# Patient Record
Sex: Female | Born: 1975 | Race: White | Hispanic: No | Marital: Single | Smoking: Never smoker
Health system: Southern US, Community
[De-identification: ages and names within clinical notes are randomized; demographics above are authoritative.]

## PROBLEM LIST (undated history)

## (undated) DIAGNOSIS — I10 Essential (primary) hypertension: Secondary | ICD-10-CM

## (undated) HISTORY — PX: NO PAST SURGERIES: SHX2092

---

## 2011-03-29 ENCOUNTER — Emergency Department (HOSPITAL_COMMUNITY)
Admission: EM | Admit: 2011-03-29 | Discharge: 2011-03-29 | Disposition: A | Discharge status: Home / Self Care | Payer: Self-pay | Attending: Emergency Medicine | Admitting: Emergency Medicine

## 2011-03-29 ENCOUNTER — Encounter: Payer: Self-pay | Admitting: Emergency Medicine

## 2011-03-29 DIAGNOSIS — R6883 Chills (without fever): Secondary | ICD-10-CM | POA: Insufficient documentation

## 2011-03-29 DIAGNOSIS — R200 Anesthesia of skin: Secondary | ICD-10-CM

## 2011-03-29 DIAGNOSIS — H43399 Other vitreous opacities, unspecified eye: Secondary | ICD-10-CM | POA: Insufficient documentation

## 2011-03-29 DIAGNOSIS — R209 Unspecified disturbances of skin sensation: Secondary | ICD-10-CM | POA: Insufficient documentation

## 2011-03-29 LAB — DIFFERENTIAL
Basophils Absolute: 0 10*3/uL (ref 0.0–0.1)
Basophils Relative: 0 % (ref 0–1)
Eosinophils Absolute: 0.1 10*3/uL (ref 0.0–0.7)
Eosinophils Relative: 1 % (ref 0–5)
Monocytes Absolute: 0.6 10*3/uL (ref 0.1–1.0)
Monocytes Relative: 7 % (ref 3–12)

## 2011-03-29 LAB — BASIC METABOLIC PANEL
BUN: 11 mg/dL (ref 6–23)
Calcium: 9.6 mg/dL (ref 8.4–10.5)
Chloride: 103 mEq/L (ref 96–112)
Creatinine, Ser: 0.7 mg/dL (ref 0.50–1.10)
GFR calc Af Amer: >90 mL/min (ref >90)
GFR calc non Af Amer: >90 mL/min (ref >90)

## 2011-03-29 LAB — CBC
HCT: 41.8 % (ref 36.0–46.0)
Hemoglobin: 14.1 g/dL (ref 12.0–15.0)
MCH: 29.6 pg (ref 26.0–34.0)
MCHC: 33.7 g/dL (ref 30.0–36.0)
MCV: 87.6 fL (ref 78.0–100.0)
RDW: 13.4 % (ref 11.5–15.5)

## 2011-03-29 NOTE — ED Notes (Signed)
Medical screening exam:  36 year old female had onset at about 1730 of a heavy feeling in her head and upper body and then numbness in her left hand. There was some associated lightheadedness and some dry mouth. No circumoral numbness. She did not notice any focal weakness. Brief neurologic exam shows her mentation appears to be normal, cranial nerves are intact without any facial droop, there are no focal motor or sensory deficits. There is no pronator drift and no extinction on double simultaneous stimulation. I see no evidence of acute stroke and suspect her symptoms are related to hyperventilation.  Dione Booze, MD 03/29/11 [phone]

## 2011-03-29 NOTE — ED Notes (Signed)
Patient with heaviness and numbness in left arm/hand.  Patient states that it started around 1730 today.  Patient is CAOx3, family at bedside.

## 2011-03-29 NOTE — ED Provider Notes (Signed)
Medical screening examination/treatment/procedure(s) were performed by non-physician practitioner and as supervising physician I was immediately available for consultation/collaboration.  Avielle Imbert, MD 03/29/11 2335 

## 2011-03-29 NOTE — ED Provider Notes (Signed)
History     CSN: [id number]  Arrival date & time 03/29/11  6213   First MD Initiated Contact with Patient 03/29/11 2134      Chief Complaint  Patient presents with  . 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  She said at her computer and noticed a heaviness in her upper body and then numbness and tingling in bilateral arms and she felt very higher suddenly she also reports, that she's been having floaters in her eyes bilaterally for the last several months.  She has not had an eye exam in over 2 years.  She does wear glasses.  She denies headaches, nausea, vomiting, previous history of same has a family history of diabetes and heart disease, but no personal history of either.  She has been under a great deal of stress lately as her husband passed away 2 months ago from an MI.  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  The symptoms have now resolved  The history is provided by the patient.    History reviewed. No pertinent past medical history.  History reviewed. No pertinent past surgical history.  History reviewed. No pertinent family history.  History  Substance Use Topics  . Smoking status: Never Smoker   . Smokeless tobacco: Not on file  . Alcohol Use: No    OB History    Grav Para Term Preterm Abortions TAB SAB Ect Mult Living                  Review of Systems  Constitutional: Positive for chills. Negative for fever.  HENT: Negative for rhinorrhea.   Eyes: Positive for visual disturbance.  Respiratory: Negative for cough and shortness of breath.   Cardiovascular: Negative for chest pain.  Gastrointestinal: Negative for nausea.  Genitourinary: Negative.   Musculoskeletal: Negative.   Skin: Negative.     Neurological: Positive for numbness. Negative for dizziness, syncope, facial asymmetry and weakness.  Hematological: Negative.   Psychiatric/Behavioral: Negative.     Allergies  Review of patient's allergies indicates no known allergies.  Home Medications   Current Outpatient Rx  Name Route Sig Dispense Refill  . VITAMIN C PO Oral Take 1 tablet by mouth every morning.      . SUPER B COMPLEX PO TABS Oral Take 1 tablet by mouth every morning.      Marland Kitchen BILBERRY PO Oral Take 1 tablet by mouth every morning.      Marland Kitchen CALCIUM + D PO Oral Take 1 tablet by mouth every morning.      Marland Kitchen OMEGA-3 FATTY ACIDS 1000 MG PO CAPS Oral Take 1 g by mouth daily.      . ADULT MULTIVITAMIN W/MINERALS CH Oral Take 1 tablet by mouth every morning.        BP 151/79  Pulse 98  Temp(Src) 98.4 F (36.9 C) (Oral)  Resp 18  SpO2 97%  Physical Exam  Constitutional: She is oriented to person, place, and time. She appears well-developed and well-nourished.  HENT:  Head: Normocephalic.  Right Ear: External ear normal.  Left Ear: External ear normal.  Nose: Nose normal.  Mouth/Throat: Oropharynx is clear and moist.  Eyes: EOM are normal. Pupils are equal, round, and reactive to light. Right eye exhibits no discharge. Left eye exhibits no discharge. No scleral  icterus.  Neck: Normal range of motion.  Cardiovascular: Normal rate.   Pulmonary/Chest: Effort normal.  Abdominal: Soft.  Musculoskeletal: Normal range of motion.  Neurological: She is alert and oriented to person, place, and time.  Skin: Skin is warm and dry.  Psychiatric: She has a normal mood and affect.    ED Course  Procedures (including critical care time)   Labs Reviewed  CBC  DIFFERENTIAL  BASIC METABOLIC PANEL   No results found.   1. Numbness and tingling     Patient was examined by Dr. Renella Cunas pot prior to my arrival and assessment to not be an acute stroke.  He did order CBC, electrolytes, which I am awaiting the results  MDM    After examining this patient.  I feel that this is most likely panic or anxiety       Arman Filter, NP 03/29/11 2215  Arman Filter, NP 03/29/11 2313

## 2011-03-29 NOTE — ED Notes (Signed)
Pt c/o heaviness all over body with left arm numbness and "sparkles in eyes" starting today at 1700; pt with no neuro deficits at present; EDP DG to eval pt

## 2013-08-15 ENCOUNTER — Emergency Department (INDEPENDENT_AMBULATORY_CARE_PROVIDER_SITE_OTHER)
Admission: EM | Admit: 2013-08-15 | Discharge: 2013-08-15 | Disposition: A | Discharge status: Home / Self Care | Payer: Self-pay | Source: Home / Self Care | Attending: Family Medicine | Admitting: Family Medicine

## 2013-08-15 ENCOUNTER — Encounter (HOSPITAL_COMMUNITY): Payer: Self-pay | Admitting: Emergency Medicine

## 2013-08-15 DIAGNOSIS — R609 Edema, unspecified: Secondary | ICD-10-CM

## 2013-08-15 DIAGNOSIS — E8779 Other fluid overload: Secondary | ICD-10-CM

## 2013-08-15 DIAGNOSIS — R7309 Other abnormal glucose: Secondary | ICD-10-CM

## 2013-08-15 DIAGNOSIS — I10 Essential (primary) hypertension: Secondary | ICD-10-CM | POA: Diagnosis present

## 2013-08-15 DIAGNOSIS — R7303 Prediabetes: Secondary | ICD-10-CM | POA: Diagnosis present

## 2013-08-15 DIAGNOSIS — E669 Obesity, unspecified: Secondary | ICD-10-CM | POA: Diagnosis present

## 2013-08-15 LAB — COMPREHENSIVE METABOLIC PANEL
ALBUMIN: 3.7 g/dL (ref 3.5–5.2)
ALK PHOS: 95 U/L (ref 39–117)
ALT: 25 U/L (ref 0–35)
AST: 17 U/L (ref 0–37)
BILIRUBIN TOTAL: 0.4 mg/dL (ref 0.3–1.2)
BUN: 9 mg/dL (ref 6–23)
CHLORIDE: 100 meq/L (ref 96–112)
CO2: 26 mEq/L (ref 19–32)
Calcium: 9.2 mg/dL (ref 8.4–10.5)
Creatinine, Ser: 0.76 mg/dL (ref 0.50–1.10)
GFR calc non Af Amer: >90 mL/min (ref >90)
GLUCOSE: 116 mg/dL — AB (ref 70–99)
POTASSIUM: 3.9 meq/L (ref 3.7–5.3)
SODIUM: 138 meq/L (ref 137–147)
Total Protein: 7.6 g/dL (ref 6.0–8.3)

## 2013-08-15 LAB — CBC
HCT: 44.9 % (ref 36.0–46.0)
HEMOGLOBIN: 15 g/dL (ref 12.0–15.0)
MCH: 30.4 pg (ref 26.0–34.0)
MCHC: 33.4 g/dL (ref 30.0–36.0)
MCV: 91.1 fL (ref 78.0–100.0)
Platelets: 176 10*3/uL (ref 150–400)
RBC: 4.93 MIL/uL (ref 3.87–5.11)
RDW: 13.6 % (ref 11.5–15.5)
WBC: 7.5 10*3/uL (ref 4.0–10.5)

## 2013-08-15 LAB — D-DIMER, QUANTITATIVE (NOT AT ARMC)

## 2013-08-15 LAB — PRO B NATRIURETIC PEPTIDE: Pro B Natriuretic peptide (BNP): 57.9 pg/mL (ref 0–125)

## 2013-08-15 MED ORDER — LISINOPRIL-HYDROCHLOROTHIAZIDE 10-12.5 MG PO TABS: 1.0000 | ORAL_TABLET | Freq: Every day | ORAL | Status: DC

## 2013-08-15 NOTE — ED Notes (Signed)
Pt c/o bilateral leg/foot swelling onset 2 yrs +++  Also c/o intermittent hand swelling No PCP; alert w/no signs of acute distress.

## 2013-08-15 NOTE — Discharge Instructions (Signed)
Thank you for coming in today. [facility].  Recheck blood work in about one month.  Make sure to use contraception to avoid pregnancy with a blood pressure medication. Call or go to the emergency room if you get worse, have trouble breathing, have chest pains, or palpitations.    Arterial Hypertension Arterial hypertension (high blood pressure) is a condition of elevated pressure in your blood vessels. Hypertension over a long period of time is a risk factor for strokes, heart attacks, and heart failure. It is also the leading cause of kidney (renal) failure.  CAUSES   In Adults -- Over 90% of all hypertension has no known cause. This is called essential or primary hypertension. In the other 10% of people with hypertension, the increase in blood pressure is caused by another disorder. This is called secondary hypertension. Important causes of secondary hypertension are:  Heavy alcohol use.  Obstructive sleep apnea.  Hyperaldosterosim (Conn's syndrome).  Steroid use.  Chronic kidney failure.  Hyperparathyroidism.  Medications.  Renal artery stenosis.  Pheochromocytoma.  Cushing's disease.  Coarctation of the aorta.  Scleroderma renal crisis.  Licorice (in excessive amounts).  Drugs (cocaine, methamphetamine). Your caregiver can explain any items above that apply to you.  In Children -- Secondary hypertension is more common and should always be considered.  Pregnancy -- Few women of childbearing age have high blood pressure. However, up to 10% of them develop hypertension of pregnancy. Generally, this will not harm the woman. It may be a sign of 3 complications of pregnancy: preeclampsia, HELLP syndrome, and eclampsia. Follow up and control with medication is necessary. SYMPTOMS   This condition normally does not produce any noticeable symptoms. It is usually found during a routine exam.  Malignant hypertension is a  late problem of high blood pressure. It may have the following symptoms:  Headaches.  Blurred vision.  End-organ damage (this means your kidneys, heart, lungs, and other organs are being damaged).  Stressful situations can increase the blood pressure. If a person with normal blood pressure has their blood pressure go up while being seen by their caregiver, this is often termed "white coat hypertension." Its importance is not known. It may be related with eventually developing hypertension or complications of hypertension.  Hypertension is often confused with mental tension, stress, and anxiety. DIAGNOSIS  The diagnosis is made by 3 separate blood pressure measurements. They are taken at least 1 week apart from each other. If there is organ damage from hypertension, the diagnosis may be made without repeat measurements. Hypertension is usually identified by having blood pressure readings:  Above 140/90 mmHg measured in both arms, at 3 separate times, over a couple weeks.  Over 130/80 mmHg should be considered a risk factor and may require treatment in patients with diabetes. Blood pressure readings over 120/80 mmHg are called "pre-hypertension" even in non-diabetic patients. To get a true blood pressure measurement, use the following guidelines. Be aware of the factors that can alter blood pressure readings.  Take measurements at least 1 hour after caffeine.  Take measurements 30 minutes after smoking and without any stress. This is another reason to quit smoking  it raises your blood pressure.  Use a proper cuff size. Ask your caregiver if you are not sure about your cuff size.  Most home blood pressure cuffs are automatic. They will measure systolic and diastolic pressures. The systolic pressure is the pressure reading at the start of sounds. Diastolic pressure  is the pressure at which the sounds disappear. If you are elderly, measure pressures in multiple postures. Try sitting, lying or  standing.  Sit at rest for a minimum of 5 minutes before taking measurements.  You should not be on any medications like decongestants. These are found in many cold medications.  Record your blood pressure readings and review them with your caregiver. If you have hypertension:  Your caregiver may do tests to be sure you do not have secondary hypertension (see "causes" above).  Your caregiver may also look for signs of metabolic syndrome. This is also called Syndrome X or Insulin Resistance Syndrome. You may have this syndrome if you have type 2 diabetes, abdominal obesity, and abnormal blood lipids in addition to hypertension.  Your caregiver will take your medical and family history and perform a physical exam.  Diagnostic tests may include blood tests (for glucose, cholesterol, potassium, and kidney function), a urinalysis, or an EKG. Other tests may also be necessary depending on your condition. PREVENTION  There are important lifestyle issues that you can adopt to reduce your chance of developing hypertension:  Maintain a normal weight.  Limit the amount of salt (sodium) in your diet.  Exercise often.  Limit alcohol intake.  Get enough potassium in your diet. Discuss specific advice with your caregiver.  Follow a DASH diet (dietary approaches to stop hypertension). This diet is rich in fruits, vegetables, and low-fat dairy products, and avoids certain fats. PROGNOSIS  Essential hypertension cannot be cured. Lifestyle changes and medical treatment can lower blood pressure and reduce complications. The prognosis of secondary hypertension depends on the underlying cause. Many people whose hypertension is controlled with medicine or lifestyle changes can live a normal, healthy life.  RISKS AND COMPLICATIONS  While high blood pressure alone is not an illness, it often requires treatment due to its short- and long-term effects on many organs. Hypertension increases your risk  for:  CVAs or strokes (cerebrovascular accident).  Heart failure due to chronically high blood pressure (hypertensive cardiomyopathy).  Heart attack (myocardial infarction).  Damage to the retina (hypertensive retinopathy).  Kidney failure (hypertensive nephropathy). Your caregiver can explain list items above that apply to you. Treatment of hypertension can significantly reduce the risk of complications. TREATMENT   For overweight patients, weight loss and regular exercise are recommended. Physical fitness lowers blood pressure.  Mild hypertension is usually treated with diet and exercise. A diet rich in fruits and vegetables, fat-free dairy products, and foods low in fat and salt (sodium) can help lower blood pressure. Decreasing salt intake decreases blood pressure in a 1/3 of people.  Stop smoking if you are a smoker. The steps above are highly effective in reducing blood pressure. While these actions are easy to suggest, they are difficult to achieve. Most patients with moderate or severe hypertension end up requiring medications to bring their blood pressure down to a normal level. There are several classes of medications for treatment. Blood pressure pills (antihypertensives) will lower blood pressure by their different actions. Lowering the blood pressure by 10 mmHg may decrease the risk of complications by as much as 25%. The goal of treatment is effective blood pressure control. This will reduce your risk for complications. Your caregiver will help you determine the best treatment for you according to your lifestyle. What is excellent treatment for one person, may not be for you. HOME CARE INSTRUCTIONS   Do not smoke.  Follow the lifestyle changes outlined in the "Prevention" section.  If  you are on medications, follow the directions carefully. Blood pressure medications must be taken as prescribed. Skipping doses reduces their benefit. It also puts you at risk for  problems.  Follow up with your caregiver, as directed.  If you are asked to monitor your blood pressure at home, follow the guidelines in the "Diagnosis" section above. SEEK MEDICAL CARE IF:   You think you are having medication side effects.  You have recurrent headaches or lightheadedness.  You have swelling in your ankles.  You have trouble with your vision. SEEK IMMEDIATE MEDICAL CARE IF:   You have sudden onset of chest pain or pressure, difficulty breathing, or other symptoms of a heart attack.  You have a severe headache.  You have symptoms of a stroke (such as sudden weakness, difficulty speaking, difficulty walking). MAKE SURE YOU:   Understand these instructions.  Will watch your condition.  Will get help right away if you are not doing well or get worse. Document Released: 03/07/2005 Document Revised: 05/30/2011 Document Reviewed: 10/05/2006 [facility] Patient Information 2014 [place], [place].

## 2013-08-15 NOTE — ED Provider Notes (Signed)
Tasha Owens is a 38 y.o. female who presents to Urgent Care today for leg swelling for 2 years. Patient has bilateral leg swelling has been present for 2 years but worse recently. She also suspects that she has sleep apnea with daytime sleepiness and fatigue and loud snoring. Additionally she's concerned about the possibility of diabetes. She is here today to look to establish care. She has no health insurance and limited funds. She denies any chest pains significant shortness of breath or palpitations. She does not take any medications yet.   History reviewed. No pertinent past medical history. History  Substance Use Topics  . Smoking status: Never Smoker   . Smokeless tobacco: Not on file  . Alcohol Use: No   ROS as above Medications: No current facility-administered medications for this encounter.   Current Outpatient Prescriptions  Medication Sig Dispense Refill  . Ascorbic Acid (VITAMIN C PO) Take 1 tablet by mouth every morning.        . B Complex-C (SUPER B COMPLEX) TABS Take 1 tablet by mouth every morning.        . Bilberry, Vaccinium myrtillus, (BILBERRY PO) Take 1 tablet by mouth every morning.        . Calcium Carbonate-Vitamin D (CALCIUM + D PO) Take 1 tablet by mouth every morning.        . fish oil-omega-3 fatty acids 1000 MG capsule Take 1 g by mouth daily.        Marland Kitchen lisinopril-hydrochlorothiazide (ZESTORETIC) 10-12.5 MG per tablet Take 1 tablet by mouth daily.  30 tablet  1  . Multiple Vitamin (MULITIVITAMIN WITH MINERALS) TABS Take 1 tablet by mouth every morning.          Exam:  BP 151/87  Pulse 81  Temp(Src) 97.7 F (36.5 C) (Oral)  Resp 12  SpO2 99% Gen: Well NAD obese HEENT: EOMI,  MMM large neck Lungs: Normal work of breathing. CTABL Heart: RRR no MRG Abd: NABS, Soft. NT, ND Exts: Brisk capillary refill, warm and well perfused. 1+ pitting edema to the shins bilaterally. Pulses are intact distally.  Labs are fasting Results for orders placed during  the hospital encounter of 08/15/13 (from the past 24 hour(s))  COMPREHENSIVE METABOLIC PANEL     Status: Abnormal   Collection Time    08/15/13 10:14 AM      Result Value Ref Range   Sodium 138  137 - 147 mEq/L   Potassium 3.9  3.7 - 5.3 mEq/L   Chloride 100  96 - 112 mEq/L   CO2 26  19 - 32 mEq/L   Glucose, Bld 116 (*) 70 - 99 mg/dL   BUN 9  6 - 23 mg/dL   Creatinine, Ser 1.15  0.50 - 1.10 mg/dL   Calcium 9.2  8.4 - 72.6 mg/dL   Total Protein 7.6  6.0 - 8.3 g/dL   Albumin 3.7  3.5 - 5.2 g/dL   AST 17  0 - 37 U/L   ALT 25  0 - 35 U/L   Alkaline Phosphatase 95  39 - 117 U/L   Total Bilirubin 0.4  0.3 - 1.2 mg/dL   GFR calc non Af Amer >90  >90 mL/min   GFR calc Af Amer >90  >90 mL/min  D-DIMER, QUANTITATIVE     Status: None   Collection Time    08/15/13 10:14 AM      Result Value Ref Range   D-Dimer, Quant <0.27  0.00 - 0.48 ug/mL-FEU  PRO  B NATRIURETIC PEPTIDE     Status: None   Collection Time    08/15/13 10:14 AM      Result Value Ref Range   Pro B Natriuretic peptide (BNP) 57.9  0 - 125 pg/mL  CBC     Status: None   Collection Time    08/15/13 10:14 AM      Result Value Ref Range   WBC 7.5  4.0 - 10.5 K/uL   RBC 4.93  3.87 - 5.11 MIL/uL   Hemoglobin 15.0  12.0 - 15.0 g/dL   HCT 16.144.9  09.636.0 - 04.546.0 %   MCV 91.1  78.0 - 100.0 fL   MCH 30.4  26.0 - 34.0 pg   MCHC 33.4  30.0 - 36.0 g/dL   RDW 40.913.6  81.111.5 - 91.415.5 %   Platelets 176  150 - 400 K/uL   No results found.  Assessment and Plan: 38 y.o. female with  1) elevated blood pressure: Plan to start lisinopril hydrochlorothiazide. Recheck labs in one month. Refer to PCP.  2) lower extremity edema: Creatinine and BNP are normal. Hydrochlorothiazide with lisinopril.  3) concern for sleep apnea: I agree. Followup with [facility] possible referral for sleep study. 4) obesity: Encourage weight loss 5) elevated fasting blood sugar: Not yet in the diabetic range. Recommend hemoglobin A1c at lab  recheck  Discussed warning signs or symptoms. Please see discharge instructions. Patient expresses understanding.    Rodolph BongEvan S Takita Riecke, MD 08/15/13 [phone]

## 2013-09-09 ENCOUNTER — Ambulatory Visit (INDEPENDENT_AMBULATORY_CARE_PROVIDER_SITE_OTHER): Payer: Self-pay | Admitting: Internal Medicine

## 2013-09-09 ENCOUNTER — Encounter: Payer: Self-pay | Admitting: Internal Medicine

## 2013-09-09 VITALS — BP 116/67 | HR 93 | Temp 97.5°F | Resp 20 | Ht 68.0 in | Wt 272.0 lb

## 2013-09-09 DIAGNOSIS — G8929 Other chronic pain: Secondary | ICD-10-CM

## 2013-09-09 DIAGNOSIS — R7303 Prediabetes: Secondary | ICD-10-CM

## 2013-09-09 DIAGNOSIS — G2581 Restless legs syndrome: Secondary | ICD-10-CM

## 2013-09-09 DIAGNOSIS — E8881 Metabolic syndrome: Secondary | ICD-10-CM

## 2013-09-09 DIAGNOSIS — M25562 Pain in left knee: Secondary | ICD-10-CM

## 2013-09-09 DIAGNOSIS — R739 Hyperglycemia, unspecified: Secondary | ICD-10-CM

## 2013-09-09 DIAGNOSIS — G4710 Hypersomnia, unspecified: Secondary | ICD-10-CM

## 2013-09-09 DIAGNOSIS — I10 Essential (primary) hypertension: Secondary | ICD-10-CM

## 2013-09-09 DIAGNOSIS — M25569 Pain in unspecified knee: Secondary | ICD-10-CM

## 2013-09-09 DIAGNOSIS — G4719 Other hypersomnia: Secondary | ICD-10-CM

## 2013-09-09 DIAGNOSIS — R7309 Other abnormal glucose: Secondary | ICD-10-CM

## 2013-09-09 DIAGNOSIS — R609 Edema, unspecified: Secondary | ICD-10-CM

## 2013-09-09 DIAGNOSIS — R600 Localized edema: Secondary | ICD-10-CM

## 2013-09-09 LAB — TSH: TSH: 2.86 u[IU]/mL (ref 0.350–4.500)

## 2013-09-09 LAB — HEMOGLOBIN A1C
HEMOGLOBIN A1C: 6.6 % — AB (ref <5.7)
Mean Plasma Glucose: 143 mg/dL — ABNORMAL HIGH (ref <117)

## 2013-09-09 LAB — IRON: IRON: 82 ug/dL (ref 42–145)

## 2013-09-09 NOTE — Progress Notes (Signed)
Patient ID: Tasha Owens Horvath, female   DOB: 09-Jun-1975, 38 y.o.   MRN: [id number]   Tasha Owens Slabach, is a 38 y.o. female  [id number]  [id number]  DOB - 09-Jun-1975  CC:  Chief Complaint  Patient presents with  . Establish Care    NP/ Throat slightly sore today head congestion/Lt knee pain Knee tends to lock up Pt states .Pt states Bilat Leg and feet swelling       HPI: Tasha Owens Barrientez is a 38 y.o. female here today to establish medical care. Patient has No headache, No chest pain, No abdominal pain - No Nausea, No new weakness tingling or numbness, No Cough - SOB.  No Known Allergies No past medical history on file. Current Outpatient Prescriptions on File Prior to Visit  Medication Sig Dispense Refill  . lisinopril-hydrochlorothiazide (ZESTORETIC) 10-12.5 MG per tablet Take 1 tablet by mouth daily.  30 tablet  1   No current facility-administered medications on file prior to visit.   No family history on file. History   Social History  . Marital Status: Single    Spouse Name: N/A    Number of Children: N/A  . Years of Education: N/A   Occupational History  . Not on file.   Social History Main Topics  . Smoking status: Never Smoker   . Smokeless tobacco: Not on file  . Alcohol Use: No  . Drug Use: No  . Sexual Activity:    Other Topics Concern  . Not on file   Social History Narrative  . No narrative on file    Review of Systems: Constitutional: Negative for fever, chills, diaphoresis, activity change, appetite change and fatigue. HENT: Negative for ear pain, nosebleeds, congestion, facial swelling, rhinorrhea, neck pain, neck stiffness and ear discharge.  Eyes: Negative for pain, discharge, redness, itching and visual disturbance. Respiratory: Negative for cough, choking, chest tightness, shortness of breath, wheezing and stridor.  Cardiovascular: Negative for chest pain, palpitations and leg swelling. Gastrointestinal: Negative for abdominal  distention. Genitourinary: Negative for dysuria, urgency, frequency, hematuria, flank pain, decreased urine volume, difficulty urinating and dyspareunia.  Musculoskeletal: Negative for back pain, joint swelling, arthralgia and gait problem. Neurological: Negative for dizziness, tremors, seizures, syncope, facial asymmetry, speech difficulty, weakness, light-headedness, numbness and headaches.  Hematological: Negative for adenopathy. Does not bruise/bleed easily. Psychiatric/Behavioral: Negative for hallucinations, behavioral problems, confusion, dysphoric mood, decreased concentration and agitation.    Objective:         Filed Vitals:   09/09/13 1509  BP: 116/67  Pulse: 93  Temp: 97.5 F (36.4 C)  Resp: 20    Physical Exam: Constitutional: Patient appears well-developed and well-nourished. No distress. HENT: Normocephalic, atraumatic, External right and left ear normal. Oropharynx is clear and moist.  Eyes: Conjunctivae and EOM are normal. PERRLA, no scleral icterus. Neck: Normal ROM. Neck supple. No JVD. No tracheal deviation. No thyromegaly. CVS: RRR, S1/S2 +, no murmurs, no gallops, no carotid bruit.  Pulmonary: Effort and breath sounds normal, no stridor, rhonchi, wheezes, rales.  Abdominal: Soft. BS +, no distension, tenderness, rebound or guarding.  Musculoskeletal: Normal range of motion. No edema and no tenderness.  Lymphadenopathy: No lymphadenopathy noted, cervical, inguinal or axillary Neuro: Alert. Normal reflexes, muscle tone coordination. No cranial nerve deficit. Skin: Skin is warm and dry. No rash noted. Not diaphoretic. No erythema. No pallor. Psychiatric: Normal mood and affect. Behavior, judgment, thought content normal.  Lab Results  Component Value Date   WBC 7.5 08/15/2013   HGB 15.0 08/15/2013  HCT 44.9 08/15/2013   MCV 91.1 08/15/2013   PLT 176 08/15/2013   Lab Results  Component Value Date   CREATININE 0.76 08/15/2013   BUN 9 08/15/2013   NA 138  08/15/2013   K 3.9 08/15/2013   CL 100 08/15/2013   CO2 26 08/15/2013    No results found for this basename: HGBA1C   Lipid Panel  No results found for this basename: chol, trig, hdl, cholhdl, vldl, ldlcalc       Assessment and plan:   1. Essential hypertension - Pt was initially evaluated in the ED where she was diagnosed with stage 1 HTN. She was started on Zestoretic. Will check electrolytes to ensure no abnormalities in setting of recently started diuretic and ACE-I. BP is within normal range today and patient denies any symptoms of orthostasis but does endorse fatigue.  - Lipid panel; Future - EKG 12-Lead -BMET  2. Excessive daytime sleepiness - Her body habitus is consistent with increased risk for Obstructive sleep apnea. - TSH - Nocturnal polysomnography with REM behavior disorder; Future  3. Severe obesity (BMI >= 40) - Briefly spoke about need for exercises and diabetes. She is going on vacation and wants to wait until after her vacation to embark on a weight loss lifestyle change.  - EKG 12-Lead - TSH - Nocturnal polysomnography with REM behavior disorder; Future  4. Hyperglycemia - Pt has a history of Diabetes in her family and was found to have hyperglycemia on evaluation of her lab studies.  - Hemoglobin A1C  5. Prediabetes - See above - Lipid panel; Future - Hemoglobin A1C - EKG 12-Lead  6. Edema of both legs Pt has venous stasis which is likely in some part due to obesity. However with all the other presenting symptoms, I cannot ignore the possibility of dysthyroid state.  - TSH  7. Metabolic syndrome - See above - Hemoglobin A1C  8. Restless legs - Pt describes restless legs and awaking frequently at night  - Check Iron levels - Nocturnal polysomnography with REM behavior disorder; Future   Return in about 2 weeks (around 09/23/2013) for daytime sleepiness, metabolic syndrome, Obesity, HTN, Annual Physical.  The patient was given clear instructions  to go to ER or return to medical center if symptoms don't improve, worsen or new problems develop. The patient verbalized understanding. The patient was told to call to get lab results if they haven't heard anything in the next week.     This note has been created with Education officer, environmentalDragon speech recognition software and smart phrase technology. Any transcriptional errors are unintentional.    MATTHEWS,MICHELLE A., MD [facility] [place], [place] [phone]   09/09/2013, 3:56 PM

## 2013-09-10 LAB — URINALYSIS
Bilirubin Urine: NEGATIVE
Glucose, UA: NEGATIVE mg/dL
Hgb urine dipstick: NEGATIVE
KETONES UR: NEGATIVE mg/dL
NITRITE: NEGATIVE
PH: 6 (ref 5.0–8.0)
Protein, ur: NEGATIVE mg/dL
SPECIFIC GRAVITY, URINE: 1.018 (ref 1.005–1.030)
UROBILINOGEN UA: 0.2 mg/dL (ref 0.0–1.0)

## 2013-09-10 LAB — VITAMIN D 25 HYDROXY (VIT D DEFICIENCY, FRACTURES): Vit D, 25-Hydroxy: 24 ng/mL — ABNORMAL LOW (ref 30–89)

## 2013-10-31 ENCOUNTER — Encounter (HOSPITAL_BASED_OUTPATIENT_CLINIC_OR_DEPARTMENT_OTHER): Payer: Self-pay

## 2013-12-26 ENCOUNTER — Encounter (HOSPITAL_BASED_OUTPATIENT_CLINIC_OR_DEPARTMENT_OTHER): Payer: Self-pay

## 2014-02-07 ENCOUNTER — Other Ambulatory Visit: Payer: Self-pay | Admitting: Internal Medicine

## 2014-02-07 MED ORDER — LISINOPRIL-HYDROCHLOROTHIAZIDE 10-12.5 MG PO TABS: 1.0000 | ORAL_TABLET | Freq: Every day | ORAL | Status: DC

## 2014-02-07 NOTE — Telephone Encounter (Signed)
Refilled to pharmacy. Thanks!  

## 2014-03-17 ENCOUNTER — Ambulatory Visit: Payer: Self-pay | Attending: Internal Medicine | Admitting: Internal Medicine

## 2014-03-17 ENCOUNTER — Encounter: Payer: Self-pay | Admitting: Internal Medicine

## 2014-03-17 ENCOUNTER — Ambulatory Visit (HOSPITAL_BASED_OUTPATIENT_CLINIC_OR_DEPARTMENT_OTHER): Payer: Self-pay

## 2014-03-17 VITALS — BP 133/91 | HR 90 | Temp 98.0°F | Resp 16 | Wt 260.6 lb

## 2014-03-17 DIAGNOSIS — Z833 Family history of diabetes mellitus: Secondary | ICD-10-CM | POA: Insufficient documentation

## 2014-03-17 DIAGNOSIS — R1011 Right upper quadrant pain: Secondary | ICD-10-CM | POA: Insufficient documentation

## 2014-03-17 DIAGNOSIS — Z139 Encounter for screening, unspecified: Secondary | ICD-10-CM

## 2014-03-17 DIAGNOSIS — Z23 Encounter for immunization: Secondary | ICD-10-CM | POA: Insufficient documentation

## 2014-03-17 DIAGNOSIS — R0683 Snoring: Secondary | ICD-10-CM | POA: Insufficient documentation

## 2014-03-17 DIAGNOSIS — R05 Cough: Secondary | ICD-10-CM | POA: Insufficient documentation

## 2014-03-17 DIAGNOSIS — R7309 Other abnormal glucose: Secondary | ICD-10-CM | POA: Insufficient documentation

## 2014-03-17 DIAGNOSIS — R7303 Prediabetes: Secondary | ICD-10-CM

## 2014-03-17 DIAGNOSIS — G4719 Other hypersomnia: Secondary | ICD-10-CM | POA: Insufficient documentation

## 2014-03-17 DIAGNOSIS — R1013 Epigastric pain: Secondary | ICD-10-CM | POA: Insufficient documentation

## 2014-03-17 DIAGNOSIS — I10 Essential (primary) hypertension: Secondary | ICD-10-CM | POA: Insufficient documentation

## 2014-03-17 DIAGNOSIS — Z79899 Other long term (current) drug therapy: Secondary | ICD-10-CM | POA: Insufficient documentation

## 2014-03-17 DIAGNOSIS — H6123 Impacted cerumen, bilateral: Secondary | ICD-10-CM | POA: Insufficient documentation

## 2014-03-17 LAB — COMPLETE METABOLIC PANEL WITH GFR
ALT: 24 U/L (ref 0–35)
AST: 18 U/L (ref 0–37)
Albumin: 4 g/dL (ref 3.5–5.2)
Alkaline Phosphatase: 81 U/L (ref 39–117)
BILIRUBIN TOTAL: 0.4 mg/dL (ref 0.2–1.2)
BUN: 8 mg/dL (ref 6–23)
CO2: 28 meq/L (ref 19–32)
CREATININE: 0.73 mg/dL (ref 0.50–1.10)
Calcium: 9.6 mg/dL (ref 8.4–10.5)
Chloride: 101 mEq/L (ref 96–112)
GFR, Est African American: >89 mL/min
GLUCOSE: 139 mg/dL — AB (ref 70–99)
Potassium: 4.9 mEq/L (ref 3.5–5.3)
Sodium: 140 mEq/L (ref 135–145)
Total Protein: 7.2 g/dL (ref 6.0–8.3)

## 2014-03-17 LAB — POCT GLYCOSYLATED HEMOGLOBIN (HGB A1C): Hemoglobin A1C: 6

## 2014-03-17 MED ORDER — LOSARTAN POTASSIUM-HCTZ 50-12.5 MG PO TABS: 1.0000 | ORAL_TABLET | Freq: Every day | ORAL | Status: AC

## 2014-03-17 MED ORDER — CARBAMIDE PEROXIDE 6.5 % OT SOLN: 5.0000 [drp] | Freq: Two times a day (BID) | OTIC | Status: AC

## 2014-03-17 MED ORDER — OMEPRAZOLE 20 MG PO CPDR: 20.0000 mg | DELAYED_RELEASE_CAPSULE | Freq: Every day | ORAL | Status: AC

## 2014-03-17 NOTE — Progress Notes (Signed)
Patient Demographics  Tasha Owens, is a 38 y.o. female  [id number]  [id number]  DOB - [birth year]  CC:  Chief Complaint  Patient presents with  . Establish Care       HPI: Tasha Owens is a 38 y.o. female here today to establish medical care.patient has history of hypertension, prediabetes, or basically, history of excessive daytime sleepiness, snoring at night, as per patient she was referred to do sleep study in the past but could not do it at bedtime, today her blood pressure is borderline elevated as per patient she has not taken her blood pressure medication today, she started taking lisinopril/hydrochlorothiazidesince last month but she reported to have dry cough and would like to change the medication denies any fever chills any chest pain or shortness of breath, today her hemoglobin A1c is 6.0%, improved compared to last time, patient does report family history of diabetes, she's also complaining of epigastric/right upper quadrant pain on and off for the last few weeks, she tends to be constipated but denies any change in bowel habits, denies drinking alcohol denies smoking cigarettes. Patient has No headache, No chest pain, No abdominal pain - No Nausea, No new weakness tingling or numbness, No Cough - SOB.  Allergies  Allergen Reactions  . Lisinopril     Cough    History reviewed. No pertinent past medical history. Current Outpatient Prescriptions on File Prior to Visit  Medication Sig Dispense Refill  . Riboflavin (VITAMIN B2 PO) Take by mouth.     No current facility-administered medications on file prior to visit.   Family History  Problem Relation Age of Onset  . Coronary artery disease Maternal Grandmother   . Diabetes Maternal Grandfather   . Coronary artery disease Maternal Grandfather   . Diabetes Father   . Heart disease Father   . Cancer Father   . Cancer Maternal Aunt     leukemia   History   Social History  . Marital Status:  Single    Spouse Name: N/A    Number of Children: N/A  . Years of Education: N/A   Occupational History  . Not on file.   Social History Main Topics  . Smoking status: Never Smoker   . Smokeless tobacco: Not on file  . Alcohol Use: No  . Drug Use: No  . Sexual Activity: No   Other Topics Concern  . Not on file   Social History Narrative    Review of Systems: Constitutional: Negative for fever, chills, diaphoresis, activity change, appetite change and fatigue. HENT: Negative for ear pain, nosebleeds, congestion, facial swelling, rhinorrhea, neck pain, neck stiffness and ear discharge.  Eyes: Negative for pain, discharge, redness, itching and visual disturbance. Respiratory: Negative for cough, choking, chest tightness, shortness of breath, wheezing and stridor.  Cardiovascular: Negative for chest pain, palpitations and leg swelling. Gastrointestinal: Negative for abdominal distention. Genitourinary: Negative for dysuria, urgency, frequency, hematuria, flank pain, decreased urine volume, difficulty urinating and dyspareunia.  Musculoskeletal: Negative for back pain, joint swelling, arthralgia and gait problem. Neurological: Negative for dizziness, tremors, seizures, syncope, facial asymmetry, speech difficulty, weakness, light-headedness, numbness and headaches.  Hematological: Negative for adenopathy. Does not bruise/bleed easily. Psychiatric/Behavioral: Negative for hallucinations, behavioral problems, confusion, dysphoric mood, decreased concentration and agitation.    Objective:   Filed Vitals:   03/17/14 1031  BP: 133/91  Pulse: 90  Temp: 98 F (36.7 C)  Resp: 16    Physical Exam: Constitutional: obese female sitting comfortably not in  acute distress. HENT: Normocephalic, atraumatic, External right and left ear normal. Oropharynx is clear and moist.increased wax in both ears.  Eyes: Conjunctivae and EOM are normal. PERRLA, no scleral icterus. Neck: Normal ROM.  Neck supple. No JVD. No tracheal deviation. No thyromegaly. CVS: RRR, S1/S2 +, no murmurs, no gallops, no carotid bruit.  Pulmonary: Effort and breath sounds normal, no stridor, rhonchi, wheezes, rales.  Abdominal: Soft. BS +, no distension, tenderness, rebound or guarding.  Musculoskeletal: Normal range of motion. No edema and no tenderness.  Neuro: Alert. Normal reflexes, muscle tone coordination. No cranial nerve deficit. Skin: Skin is warm and dry. No rash noted. Not diaphoretic. No erythema. No pallor. Psychiatric: Normal mood and affect. Behavior, judgment, thought content normal.  Lab Results  Component Value Date   WBC 7.5 08/15/2013   HGB 15.0 08/15/2013   HCT 44.9 08/15/2013   MCV 91.1 08/15/2013   PLT 176 08/15/2013   Lab Results  Component Value Date   CREATININE 0.76 08/15/2013   BUN 9 08/15/2013   NA 138 08/15/2013   K 3.9 08/15/2013   CL 100 08/15/2013   CO2 26 08/15/2013    Lab Results  Component Value Date   HGBA1C 6.0 03/17/2014   Lipid Panel  No results found for: CHOL, TRIG, HDL, CHOLHDL, VLDL, LDLCALC     Assessment and plan:   1. Pre-diabetes Results for orders placed or performed in visit on 03/17/14  HgB A1c  Result Value Ref Range   Hemoglobin A1C 6.0    Advised patient for diabetes meal planning, she is given the handout.  2. Essential hypertension Patient developed a dry cough with lisinopril, I have switched her to Hyzaar, also advise patient for DASH diet. - losartan-hydrochlorothiazide (HYZAAR) 50-12.5 MG per tablet; Take 1 tablet by mouth daily.  Dispense: 90 tablet; Refill: 1 - COMPLETE METABOLIC PANEL WITH GFR  3. Snoring/10. Excessive daytime sleepiness  - Ambulatory referral to Sleep Studies  4. Severe obesity (BMI >= 40) Advised patient to continue with  diet and exercise. Patient has lost more the 10 pounds in the last 6 months.  5. RUQ pain  - US Abdomen Complete; Future  6. Dyspepsia Advised patient for last  modification, trial of Prilosec - omeprazole (PRILOSEC) 20 MG capsule; Take 1 capsule (20 mg total) by mouth daily.  Dispense: 30 capsule; Refill: 3  7. Needs flu shot Flu shot given today.  8. Excess ear wax, bilateral  - carbamide peroxide (DEBROX) 6.5 % otic solution; Place 5 drops into both ears 2 (two) times daily.  Dispense: 15 mL; Refill: 1  9. Screening  - Vit D  25 hydroxy (rtn osteoporosis monitoring)      Health Maintenance  -Vaccinations:  Flu shot given today  Return in about 3 months (around 06/16/2014) for hypertension, prediabetes.   Doris CheadleADVANI, Mysti Haley, MD

## 2014-03-17 NOTE — Progress Notes (Signed)
Patient here to establish care Complains of having a persistent cough that started about a month after she started taking lisinopril Complains of epigastric pain that bothers her after she eats

## 2014-03-17 NOTE — Patient Instructions (Addendum)
DASH Eating Plan DASH stands for "Dietary Approaches to Stop Hypertension." The DASH eating plan is a healthy eating plan that has been shown to reduce high blood pressure (hypertension). Additional health benefits may include reducing the risk of type 2 diabetes mellitus, heart disease, and stroke. The DASH eating plan may also help with weight loss. WHAT DO I NEED TO KNOW ABOUT THE DASH EATING PLAN? For the DASH eating plan, you will follow these general guidelines:  Choose foods with a percent daily value for sodium of less than 5% (as listed on the food label).  Use salt-free seasonings or herbs instead of table salt or sea salt.  Check with your health care provider or pharmacist before using salt substitutes.  Eat lower-sodium products, often labeled as "lower sodium" or "no salt added."  Eat fresh foods.  Eat more vegetables, fruits, and low-fat dairy products.  Choose whole grains. Look for the word "whole" as the first word in the ingredient list.  Choose fish and skinless chicken or turkey more often than red meat. Limit fish, poultry, and meat to 6 oz (170 g) each day.  Limit sweets, desserts, sugars, and sugary drinks.  Choose heart-healthy fats.  Limit cheese to 1 oz (28 g) per day.  Eat more home-cooked food and less restaurant, buffet, and fast food.  Limit fried foods.  Cook foods using methods other than frying.  Limit canned vegetables. If you do use them, rinse them well to decrease the sodium.  When eating at a restaurant, ask that your food be prepared with less salt, or no salt if possible. WHAT FOODS CAN I EAT? Seek help from a dietitian for individual calorie needs. Grains Whole grain or whole wheat bread. Brown rice. Whole grain or whole wheat pasta. Quinoa, bulgur, and whole grain cereals. Low-sodium cereals. Corn or whole wheat flour tortillas. Whole grain cornbread. Whole grain crackers. Low-sodium crackers. Vegetables Fresh or frozen vegetables  (raw, steamed, roasted, or grilled). Low-sodium or reduced-sodium tomato and vegetable juices. Low-sodium or reduced-sodium tomato sauce and paste. Low-sodium or reduced-sodium canned vegetables.  Fruits All fresh, canned (in natural juice), or frozen fruits. Meat and Other Protein Products Ground beef (85% or leaner), grass-fed beef, or beef trimmed of fat. Skinless chicken or turkey. Ground chicken or turkey. Pork trimmed of fat. All fish and seafood. Eggs. Dried beans, peas, or lentils. Unsalted nuts and seeds. Unsalted canned beans. Dairy Low-fat dairy products, such as skim or 1% milk, 2% or reduced-fat cheeses, low-fat ricotta or cottage cheese, or plain low-fat yogurt. Low-sodium or reduced-sodium cheeses. Fats and Oils Tub margarines without trans fats. Light or reduced-fat mayonnaise and salad dressings (reduced sodium). Avocado. Safflower, olive, or canola oils. Natural peanut or almond butter. Other Unsalted popcorn and pretzels. The items listed above may not be a complete list of recommended foods or beverages. Contact your dietitian for more options. WHAT FOODS ARE NOT RECOMMENDED? Grains White bread. White pasta. White rice. Refined cornbread. Bagels and croissants. Crackers that contain trans fat. Vegetables Creamed or fried vegetables. Vegetables in a cheese sauce. Regular canned vegetables. Regular canned tomato sauce and paste. Regular tomato and vegetable juices. Fruits Dried fruits. Canned fruit in light or heavy syrup. Fruit juice. Meat and Other Protein Products Fatty cuts of meat. Ribs, chicken wings, bacon, sausage, bologna, salami, chitterlings, fatback, hot dogs, bratwurst, and packaged luncheon meats. Salted nuts and seeds. Canned beans with salt. Dairy Whole or 2% milk, cream, half-and-half, and cream cheese. Whole-fat or sweetened yogurt. Full-fat   cheeses or blue cheese. Nondairy creamers and whipped toppings. Processed cheese, cheese spreads, or cheese  curds. Condiments Onion and garlic salt, seasoned salt, table salt, and sea salt. Canned and packaged gravies. Worcestershire sauce. Tartar sauce. Barbecue sauce. Teriyaki sauce. Soy sauce, including reduced sodium. Steak sauce. Fish sauce. Oyster sauce. Cocktail sauce. Horseradish. Ketchup and mustard. Meat flavorings and tenderizers. Bouillon cubes. Hot sauce. Tabasco sauce. Marinades. Taco seasonings. Relishes. Fats and Oils Butter, stick margarine, lard, shortening, ghee, and bacon fat. Coconut, palm kernel, or palm oils. Regular salad dressings. Other Pickles and olives. Salted popcorn and pretzels. The items listed above may not be a complete list of foods and beverages to avoid. Contact your dietitian for more information. WHERE CAN I FIND MORE INFORMATION? National Heart, Lung, and Blood Institute: www.nhlbi.nih.gov/health/health-topics/topics/dash/ Document Released: 02/24/2011 Document Revised: 07/22/2013 Document Reviewed: 01/09/2013 ExitCare Patient Information 2015 [employer]. This information is not intended to replace advice given to you by your health care provider. Make sure you discuss any questions you have with your health care provider. Diabetes Mellitus and Food It is important for you to manage your blood sugar (glucose) level. Your blood glucose level can be greatly affected by what you eat. Eating healthier foods in the appropriate amounts throughout the day at about the same time each day will help you control your blood glucose level. It can also help slow or prevent worsening of your diabetes mellitus. Healthy eating may even help you improve the level of your blood pressure and reach or maintain a healthy weight.  HOW CAN FOOD AFFECT ME? Carbohydrates Carbohydrates affect your blood glucose level more than any other type of food. Your dietitian will help you determine how many carbohydrates to eat at each meal and teach you how to count carbohydrates. Counting  carbohydrates is important to keep your blood glucose at a healthy level, especially if you are using insulin or taking certain medicines for diabetes mellitus. Alcohol Alcohol can cause sudden decreases in blood glucose (hypoglycemia), especially if you use insulin or take certain medicines for diabetes mellitus. Hypoglycemia can be a life-threatening condition. Symptoms of hypoglycemia (sleepiness, dizziness, and disorientation) are similar to symptoms of having too much alcohol.  If your health care provider has given you approval to drink alcohol, do so in moderation and use the following guidelines:  Women should not have more than one drink per day, and men should not have more than two drinks per day. One drink is equal to:  12 oz of beer.  5 oz of wine.  1 oz of hard liquor.  Do not drink on an empty stomach.  Keep yourself hydrated. Have water, diet soda, or unsweetened iced tea.  Regular soda, juice, and other mixers might contain a lot of carbohydrates and should be counted. WHAT FOODS ARE NOT RECOMMENDED? As you make food choices, it is important to remember that all foods are not the same. Some foods have fewer nutrients per serving than other foods, even though they might have the same number of calories or carbohydrates. It is difficult to get your body what it needs when you eat foods with fewer nutrients. Examples of foods that you should avoid that are high in calories and carbohydrates but low in nutrients include:  Trans fats (most processed foods list trans fats on the Nutrition Facts label).  Regular soda.  Juice.  Candy.  Sweets, such as cake, pie, doughnuts, and cookies.  Fried foods. WHAT FOODS CAN I EAT? Have nutrient-rich foods,   which will nourish your body and keep you healthy. The food you should eat also will depend on several factors, including:  The calories you need.  The medicines you take.  Your weight.  Your blood glucose level.  Your  blood pressure level.  Your cholesterol level. You also should eat a variety of foods, including:  Protein, such as meat, poultry, fish, tofu, nuts, and seeds (lean animal proteins are best).  Fruits.  Vegetables.  Dairy products, such as milk, cheese, and yogurt (low fat is best).  Breads, grains, pasta, cereal, rice, and beans.  Fats such as olive oil, trans fat-free margarine, canola oil, avocado, and olives. DOES EVERYONE WITH DIABETES MELLITUS HAVE THE SAME MEAL PLAN? Because every person with diabetes mellitus is different, there is not one meal plan that works for everyone. It is very important that you meet with a dietitian who will help you create a meal plan that is just right for you. Document Released: 12/02/2004 Document Revised: 03/12/2013 Document Reviewed: 02/01/2013 ExitCare Patient Information 2015 [employer]. This information is not intended to replace advice given to you by your health care provider. Make sure you discuss any questions you have with your health care provider.  

## 2014-03-18 ENCOUNTER — Emergency Department (HOSPITAL_COMMUNITY)
Admission: EM | Admit: 2014-03-18 | Discharge: 2014-03-18 | Disposition: A | Discharge status: Home / Self Care | Payer: Self-pay | Attending: Emergency Medicine | Admitting: Emergency Medicine

## 2014-03-18 ENCOUNTER — Encounter (HOSPITAL_COMMUNITY): Payer: Self-pay | Admitting: Emergency Medicine

## 2014-03-18 DIAGNOSIS — N3000 Acute cystitis without hematuria: Secondary | ICD-10-CM | POA: Insufficient documentation

## 2014-03-18 DIAGNOSIS — I10 Essential (primary) hypertension: Secondary | ICD-10-CM | POA: Insufficient documentation

## 2014-03-18 DIAGNOSIS — R1013 Epigastric pain: Secondary | ICD-10-CM | POA: Insufficient documentation

## 2014-03-18 DIAGNOSIS — Z79899 Other long term (current) drug therapy: Secondary | ICD-10-CM | POA: Insufficient documentation

## 2014-03-18 HISTORY — DX: Essential (primary) hypertension: I10

## 2014-03-18 LAB — CBC WITH DIFFERENTIAL/PLATELET
BASOS ABS: 0 10*3/uL (ref 0.0–0.1)
Basophils Relative: 0 % (ref 0–1)
EOS ABS: 0.1 10*3/uL (ref 0.0–0.7)
EOS PCT: 1 % (ref 0–5)
HCT: 41.4 % (ref 36.0–46.0)
Hemoglobin: 13.5 g/dL (ref 12.0–15.0)
LYMPHS PCT: 19 % (ref 12–46)
Lymphs Abs: 1.6 10*3/uL (ref 0.7–4.0)
MCH: 29.6 pg (ref 26.0–34.0)
MCHC: 32.6 g/dL (ref 30.0–36.0)
MCV: 90.8 fL (ref 78.0–100.0)
Monocytes Absolute: 0.7 10*3/uL (ref 0.1–1.0)
Monocytes Relative: 8 % (ref 3–12)
NEUTROS PCT: 72 % (ref 43–77)
Neutro Abs: 6.2 10*3/uL (ref 1.7–7.7)
PLATELETS: 171 10*3/uL (ref 150–400)
RBC: 4.56 MIL/uL (ref 3.87–5.11)
RDW: 13.8 % (ref 11.5–15.5)
WBC: 8.6 10*3/uL (ref 4.0–10.5)

## 2014-03-18 LAB — URINALYSIS, ROUTINE W REFLEX MICROSCOPIC
Bilirubin Urine: NEGATIVE
Glucose, UA: NEGATIVE mg/dL
Hgb urine dipstick: NEGATIVE
Ketones, ur: NEGATIVE mg/dL
NITRITE: NEGATIVE
PROTEIN: NEGATIVE mg/dL
Specific Gravity, Urine: 1.024 (ref 1.005–1.030)
Urobilinogen, UA: 0.2 mg/dL (ref 0.0–1.0)
pH: 6 (ref 5.0–8.0)

## 2014-03-18 LAB — URINE MICROSCOPIC-ADD ON

## 2014-03-18 LAB — VITAMIN D 25 HYDROXY (VIT D DEFICIENCY, FRACTURES): Vit D, 25-Hydroxy: 19 ng/mL — ABNORMAL LOW (ref 30–100)

## 2014-03-18 LAB — LIPASE, BLOOD: Lipase: 27 U/L (ref 11–59)

## 2014-03-18 MED ORDER — GI COCKTAIL ~~LOC~~
30.0000 mL | Freq: Once | ORAL | Status: AC
  Administered 2014-03-18: 30 mL via ORAL
  Filled 2014-03-18: qty 30

## 2014-03-18 MED ORDER — CEPHALEXIN 500 MG PO CAPS: 500.0000 mg | ORAL_CAPSULE | Freq: Four times a day (QID) | ORAL | Status: AC

## 2014-03-18 MED ORDER — FENTANYL CITRATE 0.05 MG/ML IJ SOLN
50.0000 ug | Freq: Once | INTRAMUSCULAR | Status: AC
  Administered 2014-03-18: 50 ug via INTRAVENOUS

## 2014-03-18 MED ORDER — FENTANYL CITRATE 0.05 MG/ML IJ SOLN
INTRAMUSCULAR | Status: AC
  Filled 2014-03-18: qty 2

## 2014-03-18 NOTE — ED Notes (Signed)
Pt. reports mid abdominal pain with nausea onset [facility] blood test done and scheduled for abdominal ultrasound this morning to rule out gallbladder disease.

## 2014-03-18 NOTE — ED Provider Notes (Signed)
CSN: [id number]     Arrival date & time 03/18/14  0126 History   First MD Initiated Contact with Patient 03/18/14 0353     Chief Complaint  Patient presents with  . Abdominal Pain   (Consider location/radiation/quality/duration/timing/severity/associated sxs/prior Treatment) HPI Tasha Owens is a 38 yo female presenting with recurrent epigastric abd pain since Thanksgiving.  This episode began last night shortly after eating.  She describes the pain as sharp and rates as 10/10.  The pain is worse when lying down.  She deneis any fevers, chills, nausea, vomiting or diarrhea.    Past Medical History  Diagnosis Date  . Hypertension    History reviewed. No pertinent past surgical history. Family History  Problem Relation Age of Onset  . Coronary artery disease Maternal Grandmother   . Diabetes Maternal Grandfather   . Coronary artery disease Maternal Grandfather   . Diabetes Father   . Heart disease Father   . Cancer Father   . Cancer Maternal Aunt     leukemia   History  Substance Use Topics  . Smoking status: Never Smoker   . Smokeless tobacco: Not on file  . Alcohol Use: No   OB History    No data available     Review of Systems  Constitutional: Negative for fever and chills.  HENT: Negative for sore throat.   Eyes: Negative for visual disturbance.  Respiratory: Negative for cough and shortness of breath.   Cardiovascular: Negative for chest pain and leg swelling.  Gastrointestinal: Negative for nausea, vomiting and diarrhea.  Genitourinary: Negative for dysuria.  Musculoskeletal: Negative for myalgias.  Skin: Negative for rash.  Neurological: Negative for weakness, numbness and headaches.      Allergies  Lisinopril  Home Medications   Prior to Admission medications   Medication Sig Start Date End Date Taking? Authorizing Provider  carbamide peroxide (DEBROX) 6.5 % otic solution Place 5 drops into both ears 2 (two) times daily. 03/17/14   Doris Cheadleeepak Advani,  MD  losartan-hydrochlorothiazide (HYZAAR) 50-12.5 MG per tablet Take 1 tablet by mouth daily. 03/17/14   Doris Cheadleeepak Advani, MD  omeprazole (PRILOSEC) 20 MG capsule Take 1 capsule (20 mg total) by mouth daily. 03/17/14   Doris Cheadleeepak Advani, MD  Riboflavin (VITAMIN B2 PO) Take by mouth.    Historical Provider, MD   BP 120/59 mmHg  Pulse 68  Temp(Src) 98.2 F (36.8 C) (Oral)  Resp 14  SpO2 97% Physical Exam  Constitutional: She appears well-developed and well-nourished. No distress.  HENT:  Head: Normocephalic and atraumatic.  Mouth/Throat: Oropharynx is clear and moist. No oropharyngeal exudate.  Eyes: Conjunctivae are normal. Right eye exhibits no discharge. Left eye exhibits no discharge. No scleral icterus.  Neck: Neck supple. No thyromegaly present.  Cardiovascular: Normal rate, regular rhythm and intact distal pulses.   Pulmonary/Chest: Effort normal and breath sounds normal. No respiratory distress. She has no wheezes. She has no rales. She exhibits no tenderness.  Abdominal: Soft. There is tenderness in the right upper quadrant and epigastric area. There is no rigidity, no rebound, no guarding and no tenderness at McBurney's point.  Lymphadenopathy:    She has no cervical adenopathy.  Neurological: She is alert. No sensory deficit.  Skin: Skin is warm and dry. No rash noted. She is not diaphoretic.  Psychiatric: She has a normal mood and affect.  Nursing note and vitals reviewed.   ED Course  Procedures (including critical care time) Labs Review Labs Reviewed - No data to display  Imaging  Review No results found.   EKG Interpretation None      MDM   Final diagnoses:  Dyspepsia  Acute cystitis without hematuria   38 yo with recurrent abd pain.  Evaluated today by her PCP, encouraged to have outpt RUQ US to evaluate gallbladder.  Episode of epigastric abd pain began tonight after eating and worse when lying flat. CMP done in office today without abnormality. CBC, Lipase and  UA done in ED.  UA with moderate leukocytes and 11-20 WBC.  Pt's abd pain improved with GI cocktail. Discussed findings with pt and will treat for UTI. Pt is well-appearing, in no acute distress and vital signs are stable.  They appear safe to be discharged.  Discharge include follow-up with their PCP.  Return precautions provided.    Filed Vitals:   03/18/14 0400 03/18/14 0449 03/18/14 0500 03/18/14 0530  BP: 118/58 118/58 131/79 127/62  Pulse: 74 63 75 69  Temp:      TempSrc:      Resp: 13 14 22 18   SpO2: 96% 95% 96% 94%   Meds given in ED:  Medications  fentaNYL (SUBLIMAZE) injection 50 mcg (50 mcg Intravenous Given 03/18/14 0249)  gi cocktail (Maalox,Lidocaine,Donnatal) (30 mLs Oral Given 03/18/14 0428)    Discharge Medication List as of 03/18/2014  5:28 AM    START taking these medications   Details  cephALEXin (KEFLEX) 500 MG capsule Take 1 capsule (500 mg total) by mouth 4 (four) times daily., Starting 03/18/2014, Until Discontinued, Print             Harle BattiestElizabeth Andrey Mccaskill, NP 03/18/14 1804  April K Palumbo-Rasch, MD 03/18/14 2315

## 2014-03-18 NOTE — Discharge Instructions (Signed)
Please follow the directions provided.  Be sure to follow-up with your primary care provider to ensure you are getting better.  Increase your priolosec to twice a day.  Please take the antibiotic to treat your urinary tract infection as directed.  Don't hesitate to return for any new, worsening or concerning symptoms.     SEEK IMMEDIATE MEDICAL CARE IF:  You have severe back pain or lower abdominal pain.  You develop chills.  You have nausea or vomiting.  You have continued burning or discomfort with urination.

## 2014-03-19 ENCOUNTER — Ambulatory Visit (HOSPITAL_COMMUNITY): Payer: Self-pay

## 2014-03-20 ENCOUNTER — Telehealth: Payer: Self-pay

## 2014-03-20 MED ORDER — VITAMIN D (ERGOCALCIFEROL) 1.25 MG (50000 UNIT) PO CAPS
50000.0000 [IU] | ORAL_CAPSULE | ORAL | Status: AC
Start: 2014-03-20 — End: ?

## 2014-03-20 NOTE — Telephone Encounter (Signed)
-----   Message from Doris Cheadleeepak Advani, MD sent at 03/18/2014  1:42 PM EST ----- Blood work reviewed, noticed low vitamin D, call patient advise to start ergocalciferol 50,000 units once a week for the duration of  12 weeks.

## 2014-03-20 NOTE — Telephone Encounter (Signed)
Patient is aware of her lab results Prescription sent to [facility] Patient will not be able to pick up until Monday But in the mean time will take OTC vitamin D supplements

## 2014-03-26 ENCOUNTER — Inpatient Hospital Stay (HOSPITAL_COMMUNITY)
Admission: AD | Admit: 2014-03-26 | Discharge: 2014-03-26 | Disposition: A | Discharge status: Home / Self Care | Payer: Self-pay | Source: Ambulatory Visit | Attending: Family Medicine | Admitting: Family Medicine

## 2014-03-26 ENCOUNTER — Telehealth: Payer: Self-pay | Admitting: Internal Medicine

## 2014-03-26 ENCOUNTER — Encounter (HOSPITAL_COMMUNITY): Payer: Self-pay | Admitting: *Deleted

## 2014-03-26 DIAGNOSIS — I10 Essential (primary) hypertension: Secondary | ICD-10-CM | POA: Insufficient documentation

## 2014-03-26 DIAGNOSIS — N939 Abnormal uterine and vaginal bleeding, unspecified: Secondary | ICD-10-CM

## 2014-03-26 LAB — URINALYSIS, ROUTINE W REFLEX MICROSCOPIC
BILIRUBIN URINE: NEGATIVE
GLUCOSE, UA: NEGATIVE mg/dL
Ketones, ur: NEGATIVE mg/dL
Nitrite: NEGATIVE
Protein, ur: 30 mg/dL — AB
Urobilinogen, UA: 0.2 mg/dL (ref 0.0–1.0)
pH: 5.5 (ref 5.0–8.0)

## 2014-03-26 LAB — URINE MICROSCOPIC-ADD ON

## 2014-03-26 LAB — WET PREP, GENITAL
Clue Cells Wet Prep HPF POC: NONE SEEN
Trich, Wet Prep: NONE SEEN
YEAST WET PREP: NONE SEEN

## 2014-03-26 LAB — POCT PREGNANCY, URINE: Preg Test, Ur: NEGATIVE

## 2014-03-26 NOTE — Telephone Encounter (Signed)
Patient called stating that she hasn't had a menstruation in 10 years and recently started bleeding and has some some cramps. Patient would like to speak to nurse. Please f/u with pt.

## 2014-03-26 NOTE — MAU Note (Signed)
Since 2005 when delivered child, has not had a period. (never had a work up)  Started bleeding last night.  Is cramping like a period.    Just got over a UTI, [facility]  [facility].

## 2014-03-26 NOTE — MAU Provider Note (Signed)
History     CSN: [id number]  Arrival date and time: 03/26/14 1549   First Provider Initiated Contact with Patient 03/26/14 1734      Chief Complaint  Patient presents with  . Vaginal Bleeding   HPI 39 y.o. G1P1001 presents for vaginal bleeding onset 03/25/14 pm. Pt reports bil lower abd discomfort, denies pain. Pt reports discomfort "similar to when I had a period". Pt reports last menstruation in 2005 s/p vaginal delivery.Pt reports spotting on pad, reports 1 pad used today. Pt denies n/v, fever/chills, diarrhea/constipation, fever/chills.  Pt reports last intercourse in 2009, denies concern for STDs. Pt reports she has not had a pap smear since 2005, denies hx irregular pap. Pt had PMD, denies OB.    Pertinent Gynecological History: Menses: flow is light Bleeding: dysfunctional uterine bleeding Contraception: none DES exposure: denies Blood transfusions: none Sexually transmitted diseases: no past history Previous GYN Procedures: none  Last mammogram: none Date: n/a Last pap: normal Date: unknown   Past Medical History  Diagnosis Date  . Hypertension     Past Surgical History  Procedure Laterality Date  . No past surgeries      Family History  Problem Relation Age of Onset  . Coronary artery disease Maternal Grandmother   . Diabetes Maternal Grandfather   . Coronary artery disease Maternal Grandfather   . Diabetes Father   . Heart disease Father   . Cancer Father   . Cancer Maternal Aunt     leukemia    History  Substance Use Topics  . Smoking status: Never Smoker   . Smokeless tobacco: Not on file  . Alcohol Use: No    Allergies:  Allergies  Allergen Reactions  . Lisinopril Other (See Comments)    Cough     Prescriptions prior to admission  Medication Sig Dispense Refill Last Dose  . aspirin EC 81 MG tablet Take 81 mg by mouth once.   Past Month at Unknown time  . calcium carbonate (TUMS EX) 750 MG chewable tablet Chew 4 tablets by mouth daily as  needed for heartburn.   Past Month at Unknown time  . carbamide peroxide (DEBROX) 6.5 % otic solution Place 5 drops into both ears 2 (two) times daily. 15 mL 1 Past Month at Unknown time  . cephALEXin (KEFLEX) 500 MG capsule Take 1 capsule (500 mg total) by mouth 4 (four) times daily. 20 capsule 0 Past Week at Unknown time  . cholecalciferol (VITAMIN D) 1000 UNITS tablet Take 2,000 Units by mouth daily.   03/26/2014 at Unknown time  . losartan-hydrochlorothiazide (HYZAAR) 50-12.5 MG per tablet Take 1 tablet by mouth daily. 90 tablet 1 03/26/2014 at 0600  . Multiple Vitamin (MULTIVITAMIN WITH MINERALS) TABS tablet Take 1 tablet by mouth daily.   Past Month at Unknown time  . omeprazole (PRILOSEC) 20 MG capsule Take 1 capsule (20 mg total) by mouth daily. 30 capsule 3 03/26/2014 at Unknown time  . Riboflavin (VITAMIN B2 PO) Take 1 tablet by mouth daily.    Past Week at Unknown time  . Vitamin D, Ergocalciferol, (DRISDOL) 50000 UNITS CAPS capsule Take 1 capsule (50,000 Units total) by mouth every 7 (seven) days. (Patient not taking: Reported on 03/26/2014) 12 capsule 0 Not Taking at Unknown time    Results for orders placed or performed during the hospital encounter of 03/26/14 (from the past 48 hour(s))  Urinalysis, Routine w reflex microscopic     Status: Abnormal   Collection Time: 03/26/14  4:15 PM  Result Value Ref Range   Color, Urine YELLOW YELLOW   APPearance CLOUDY (A) CLEAR   Specific Gravity, Urine >1.030 (H) 1.005 - 1.030   pH 5.5 5.0 - 8.0   Glucose, UA NEGATIVE NEGATIVE mg/dL   Hgb urine dipstick LARGE (A) NEGATIVE   Bilirubin Urine NEGATIVE NEGATIVE   Ketones, ur NEGATIVE NEGATIVE mg/dL   Protein, ur 30 (A) NEGATIVE mg/dL   Urobilinogen, UA 0.2 0.0 - 1.0 mg/dL   Nitrite NEGATIVE NEGATIVE   Leukocytes, UA TRACE (A) NEGATIVE  Urine microscopic-add on     Status: Abnormal   Collection Time: 03/26/14  4:15 PM  Result Value Ref Range   Squamous Epithelial / LPF FEW (A) RARE   WBC, UA  0-2 <3 WBC/hpf   RBC / HPF 21-50 <3 RBC/hpf  Pregnancy, urine POC     Status: None   Collection Time: 03/26/14  5:21 PM  Result Value Ref Range   Preg Test, Ur NEGATIVE NEGATIVE    Comment:        THE SENSITIVITY OF THIS METHODOLOGY IS >24 mIU/mL   Wet prep, genital     Status: Abnormal   Collection Time: 03/26/14  6:05 PM  Result Value Ref Range   Yeast Wet Prep HPF POC NONE SEEN NONE SEEN   Trich, Wet Prep NONE SEEN NONE SEEN   Clue Cells Wet Prep HPF POC NONE SEEN NONE SEEN   WBC, Wet Prep HPF POC FEW (A) NONE SEEN   Review of Systems  Constitutional: Negative for fever, chills, weight loss, malaise/fatigue and diaphoresis.  Respiratory: Negative for shortness of breath and wheezing.   Cardiovascular: Negative for chest pain.  Gastrointestinal: Positive for abdominal pain. Negative for heartburn, nausea, vomiting, diarrhea, constipation, blood in stool and melena.  Genitourinary: Negative for dysuria, urgency, frequency, hematuria and flank pain.  Neurological: Negative for weakness.  Psychiatric/Behavioral: Negative for substance abuse.   Physical Exam   Blood pressure 131/72, pulse 93, temperature 98.6 F (37 C), temperature source Oral, resp. rate 20.  Physical Exam  Constitutional: She is oriented to person, place, and time. She appears well-developed and well-nourished.  Cardiovascular: Normal rate and normal heart sounds.   +2 nonpitting pedal edema  Respiratory: Effort normal and breath sounds normal. No respiratory distress.  GI: Soft. Bowel sounds are normal. She exhibits no distension and no mass. There is no tenderness. There is no rebound and no guarding.  Genitourinary: Vagina normal and uterus normal.  Scant BRB in vaginal vault. Cervix pink, anterior, cervical os closed. No lesions or polyps, no CMT, adnexal masses or tenderness.  Neurological: She is alert and oriented to person, place, and time.  Psychiatric: She has a normal mood and affect. Judgment and  thought content normal.    MAU Course  Procedures    Results for orders placed or performed during the hospital encounter of 03/26/14 (from the past 24 hour(s))  Urinalysis, Routine w reflex microscopic     Status: Abnormal   Collection Time: 03/26/14  4:15 PM  Result Value Ref Range   Color, Urine YELLOW YELLOW   APPearance CLOUDY (A) CLEAR   Specific Gravity, Urine >1.030 (H) 1.005 - 1.030   pH 5.5 5.0 - 8.0   Glucose, UA NEGATIVE NEGATIVE mg/dL   Hgb urine dipstick LARGE (A) NEGATIVE   Bilirubin Urine NEGATIVE NEGATIVE   Ketones, ur NEGATIVE NEGATIVE mg/dL   Protein, ur 30 (A) NEGATIVE mg/dL   Urobilinogen, UA 0.2 0.0 - 1.0 mg/dL  Nitrite NEGATIVE NEGATIVE   Leukocytes, UA TRACE (A) NEGATIVE  Urine microscopic-add on     Status: Abnormal   Collection Time: 03/26/14  4:15 PM  Result Value Ref Range   Squamous Epithelial / LPF FEW (A) RARE   WBC, UA 0-2 <3 WBC/hpf   RBC / HPF 21-50 <3 RBC/hpf  Pregnancy, urine POC     Status: None   Collection Time: 03/26/14  5:21 PM  Result Value Ref Range   Preg Test, Ur NEGATIVE NEGATIVE  Wet prep, genital     Status: Abnormal   Collection Time: 03/26/14  6:05 PM  Result Value Ref Range   Yeast Wet Prep HPF POC NONE SEEN NONE SEEN   Trich, Wet Prep NONE SEEN NONE SEEN   Clue Cells Wet Prep HPF POC NONE SEEN NONE SEEN   WBC, Wet Prep HPF POC FEW (A) NONE SEEN   MDM UA, upt, Wet Prep and GC. No concern for anemia due to small vaginal blood loss, no concern for infection due to absence of systemic symptoms and neg UA. UA sent for culture due to hx of UTI.  Patient declined HIV testing  Urine culture pending  Assessment and Plan  A: Abnormal Vaginal Bleeding P: Discharge home F/U with PMD and referral given to [facility] clinic eval bleeding Pt educated to return if experiences increased bleeding, abd pain, cramping, weakness or syncope  Micker Samios, FNP-S  Evaluation and management procedures were performed by the NP student  under my  supervision and collaboration. I have reviewed the note and chart, and I agree with the management and plan.   Iona Hansen Flara Storti, NP 03/26/2014 6:43 PM

## 2014-03-26 NOTE — Discharge Instructions (Signed)
You were seen today for light vaginal bleeding. Please follow up with primary care provider and the [facility]. Return if you experience increased vaginal bleeding, weakness, abdominal pain, fever or chills. Abnormal Uterine Bleeding Abnormal uterine bleeding can affect women at various stages in life, including teenagers, women in their reproductive years, pregnant women, and women who have reached menopause. Several kinds of uterine bleeding are considered abnormal, including:  Bleeding or spotting between periods.   Bleeding after sexual intercourse.   Bleeding that is heavier or more than normal.   Periods that last longer than usual.  Bleeding after menopause.  Many cases of abnormal uterine bleeding are minor and simple to treat, while others are more serious. Any type of abnormal bleeding should be evaluated by your health care provider. Treatment will depend on the cause of the bleeding. HOME CARE INSTRUCTIONS Monitor your condition for any changes. The following actions may help to alleviate any discomfort you are experiencing:  Avoid the use of tampons and douches as directed by your health care provider.  Change your pads frequently. You should get regular pelvic exams and Pap tests. Keep all follow-up appointments for diagnostic tests as directed by your health care provider.  SEEK MEDICAL CARE IF:   Your bleeding lasts more than 1 week.   You feel dizzy at times.  SEEK IMMEDIATE MEDICAL CARE IF:   You pass out.   You are changing pads every 15 to 30 minutes.   You have abdominal pain.  You have a fever.   You become sweaty or weak.   You are passing large blood clots from the vagina.   You start to feel nauseous and vomit. MAKE SURE YOU:   Understand these instructions.  Will watch your condition.  Will get help right away if you are not doing well or get worse. Document Released: 03/07/2005 Document Revised: 03/12/2013 Document  Reviewed: 10/04/2012 [employer] [employer] Patient Information 2015 [employer] [place]. This information is not intended to replace advice given to you by your health care provider. Make sure you discuss any questions you have with your health care provider.

## 2014-03-26 NOTE — Telephone Encounter (Signed)
[facility]

## 2014-03-27 LAB — URINE CULTURE
Colony Count: 7000
Special Requests: NORMAL

## 2014-03-27 LAB — GC/CHLAMYDIA PROBE AMP
CT Probe RNA: NEGATIVE
GC Probe RNA: NEGATIVE

## 2014-04-07 ENCOUNTER — Ambulatory Visit: Payer: Self-pay

## 2014-04-08 ENCOUNTER — Ambulatory Visit: Payer: Self-pay | Attending: Internal Medicine

## 2014-04-25 ENCOUNTER — Ambulatory Visit: Payer: Self-pay | Attending: Internal Medicine | Admitting: Internal Medicine

## 2014-04-25 VITALS — Temp 98.0°F

## 2014-04-25 DIAGNOSIS — W57XXXA Bitten or stung by nonvenomous insect and other nonvenomous arthropods, initial encounter: Secondary | ICD-10-CM | POA: Insufficient documentation

## 2014-04-25 DIAGNOSIS — T148 Other injury of unspecified body region: Secondary | ICD-10-CM

## 2014-04-25 DIAGNOSIS — S70362A Insect bite (nonvenomous), left thigh, initial encounter: Secondary | ICD-10-CM | POA: Insufficient documentation

## 2014-04-25 MED ORDER — DOXYCYCLINE HYCLATE 100 MG PO TABS: 100.0000 mg | ORAL_TABLET | Freq: Two times a day (BID) | ORAL | Status: AC

## 2014-04-25 NOTE — Progress Notes (Signed)
Patient presents for tick bite left inner thigh. Brought tick in plastic baggie Denies pain, fever, chills, aches States woke at 4 am feeling tick. Pt pulled tick out but black speck remains.  5 mm redness and 5 mm induration surrounding puncture wound noted   T 98.0 oral   Per PCP: Doxycycline 100 mg bid X 2 weeks. [facility]  Allow speck to come out on own as wound heals Patient instructed to keep area clean and dry and covered  May go to Urgent Care if want further attempt to remove black speck Call if sx worsen or fail to improve  Agree with RN's assessment and intervention  Doris Cheadleeepak Advani, MD

## 2014-05-01 ENCOUNTER — Encounter: Payer: Self-pay | Admitting: Obstetrics & Gynecology

## 2014-05-01 ENCOUNTER — Ambulatory Visit (INDEPENDENT_AMBULATORY_CARE_PROVIDER_SITE_OTHER): Payer: Self-pay | Admitting: Obstetrics & Gynecology

## 2014-05-01 VITALS — BP 130/79 | HR 72 | Temp 98.0°F | Ht 66.0 in | Wt 264.6 lb

## 2014-05-01 DIAGNOSIS — N939 Abnormal uterine and vaginal bleeding, unspecified: Secondary | ICD-10-CM

## 2014-05-01 DIAGNOSIS — N898 Other specified noninflammatory disorders of vagina: Secondary | ICD-10-CM

## 2014-05-01 NOTE — Progress Notes (Signed)
US scheduled for February 24th @ 1045 am.

## 2014-05-01 NOTE — Patient Instructions (Signed)
Dysfunctional Uterine Bleeding Normally, menstrual periods begin between ages 11 to 17 in young women. A normal menstrual cycle/period may begin every 23 days up to 35 days and lasts from 1 to 7 days. Around 12 to 14 days before your menstrual period starts, ovulation (ovary produces an egg) occurs. When counting the time between menstrual periods, count from the first day of bleeding of the previous period to the first day of bleeding of the next period. Dysfunctional (abnormal) uterine bleeding is bleeding that is different from a normal menstrual period. Your periods may come earlier or later than usual. They may be lighter, have blood clots or be heavier. You may have bleeding between periods, or you may skip one period or more. You may have bleeding after sexual intercourse, bleeding after menopause, or no menstrual period. CAUSES   Pregnancy (normal, miscarriage, tubal).  IUDs (intrauterine device, birth control).  Birth control pills.  Hormone treatment.  Menopause.  Infection of the cervix.  Blood clotting problems.  Infection of the inside lining of the uterus.  Endometriosis, inside lining of the uterus growing in the pelvis and other female organs.  Adhesions (scar tissue) inside the uterus.  Obesity or severe weight loss.  Uterine polyps inside the uterus.  Cancer of the vagina, cervix, or uterus.  Ovarian cysts or polycystic ovary syndrome.  Medical problems (diabetes, thyroid disease).  Uterine fibroids (noncancerous tumor).  Problems with your female hormones.  Endometrial hyperplasia, very thick lining and enlarged cells inside of the uterus.  Medicines that interfere with ovulation.  Radiation to the pelvis or abdomen.  Chemotherapy. DIAGNOSIS   Your doctor will discuss the history of your menstrual periods, medicines you are taking, changes in your weight, stress in your life, and any medical problems you may have.  Your doctor will do a physical  and pelvic examination.  Your doctor may want to perform certain tests to make a diagnosis, such as:  Pap test.  Blood tests.  Cultures for infection.  CT scan.  Ultrasound.  Hysteroscopy.  Laparoscopy.  MRI.  Hysterosalpingography.  D and C.  Endometrial biopsy. TREATMENT  Treatment will depend on the cause of the dysfunctional uterine bleeding (DUB). Treatment may include:  Observing your menstrual periods for a couple of months.  Prescribing medicines for medical problems, including:  Antibiotics.  Hormones.  Birth control pills.  Removing an IUD (intrauterine device, birth control).  Surgery:  D and C (scrape and remove tissue from inside the uterus).  Laparoscopy (examine inside the abdomen with a lighted tube).  Uterine ablation (destroy lining of the uterus with electrical current, laser, heat, or freezing).  Hysteroscopy (examine cervix and uterus with a lighted tube).  Hysterectomy (remove the uterus). HOME CARE INSTRUCTIONS   If medicines were prescribed, take exactly as directed. Do not change or switch medicines without consulting your caregiver.  Long term heavy bleeding may result in iron deficiency. Your caregiver may have prescribed iron pills. They help replace the iron that your body lost from heavy bleeding. Take exactly as directed.  Do not take aspirin or medicines that contain aspirin one week before or during your menstrual period. Aspirin may make the bleeding worse.  If you need to change your sanitary pad or tampon more than once every 2 hours, stay in bed with your feet elevated and a cold pack on your lower abdomen. Rest as much as possible, until the bleeding stops or slows down.  Eat well-balanced meals. Eat foods high in iron. Examples   are:  Leafy green vegetables.  Whole-grain breads and cereals.  Eggs.  Meat.  Liver.  Do not try to lose weight until the abnormal bleeding has stopped and your blood iron level is  back to normal. Do not lift more than ten pounds or do strenuous activities when you are bleeding.  For a couple of months, make note on your calendar, marking the start and ending of your period, and the type of bleeding (light, medium, heavy, spotting, clots or missed periods). This is for your caregiver to better evaluate your problem. SEEK MEDICAL CARE IF:   You develop nausea (feeling sick to your stomach) and vomiting, dizziness, or diarrhea while you are taking your medicine.  You are getting lightheaded or weak.  You have any problems that may be related to the medicine you are taking.  You develop pain with your DUB.  You want to remove your IUD.  You want to stop or change your birth control pills or hormones.  You have any type of abnormal bleeding mentioned above.  You are over 16 years old and have not had a menstrual period yet.  You are 39 years old and you are still having menstrual periods.  You have any of the symptoms mentioned above.  You develop a rash. SEEK IMMEDIATE MEDICAL CARE IF:   An oral temperature above 102 F (38.9 C) develops.  You develop chills.  You are changing your sanitary pad or tampon more than once an hour.  You develop abdominal pain.  You pass out or faint. Document Released: 03/04/2000 Document Revised: 05/30/2011 Document Reviewed: 02/03/2009 [employer] Patient Information 2015 [employer]. This information is not intended to replace advice given to you by your health care provider. Make sure you discuss any questions you have with your health care provider.  

## 2014-05-01 NOTE — Progress Notes (Signed)
Patient here today for annual exam and concerns about abnormal bleeding. Reports not having a period since age 39-- also reports she has not had a pap smear since 2002. Had first episode of bleeding in 10 years last month beginning on 03/24/14 and lasted for 7 days. Has not had bleeding since.

## 2014-05-01 NOTE — Progress Notes (Signed)
Patient ID: Tasha Owens, female   DOB: 10/25/1975, 39 y.o.   MRN: [id number]  Chief Complaint  Patient presents with  . Menstrual Problem  . Gynecologic Exam    HPI Tasha Owens is a 39 y.o. female.  G1P1001 Patient's last menstrual period was 03/24/2014. No menses for 10 years until LMP. Was seen in [place] at that time and referred. NO VB now. Not sexually active since widowed 2009.  HPI  Past Medical History  Diagnosis Date  . Hypertension     Past Surgical History  Procedure Laterality Date  . No past surgeries      Family History  Problem Relation Age of Onset  . Coronary artery disease Maternal Grandmother   . Diabetes Maternal Grandfather   . Coronary artery disease Maternal Grandfather   . Diabetes Father   . Heart disease Father   . Cancer Father   . Cancer Maternal Aunt     leukemia    Social History History  Substance Use Topics  . Smoking status: Never Smoker   . Smokeless tobacco: Never Used  . Alcohol Use: No    Allergies  Allergen Reactions  . Lisinopril Other (See Comments)    Cough     Current Outpatient Prescriptions  Medication Sig Dispense Refill  . Cinnamon 500 MG TABS Take by mouth.    . losartan-hydrochlorothiazide (HYZAAR) 50-12.5 MG per tablet Take 1 tablet by mouth daily. 90 tablet 1  . Multiple Vitamin (MULTIVITAMIN WITH MINERALS) TABS tablet Take 1 tablet by mouth daily.    Marland Kitchen omeprazole (PRILOSEC) 20 MG capsule Take 1 capsule (20 mg total) by mouth daily. 30 capsule 3  . Riboflavin (VITAMIN B2 PO) Take 1 tablet by mouth daily.     Marland Kitchen aspirin EC 81 MG tablet Take 81 mg by mouth once.    . calcium carbonate (TUMS EX) 750 MG chewable tablet Chew 4 tablets by mouth daily as needed for heartburn.    . carbamide peroxide (DEBROX) 6.5 % otic solution Place 5 drops into both ears 2 (two) times daily. (Patient not taking: Reported on 04/25/2014) 15 mL 1  . cephALEXin (KEFLEX) 500 MG capsule Take 1 capsule (500 mg total) by mouth 4  (four) times daily. (Patient not taking: Reported on 04/25/2014) 20 capsule 0  . cholecalciferol (VITAMIN D) 1000 UNITS tablet Take 2,000 Units by mouth daily.    Marland Kitchen doxycycline (VIBRA-TABS) 100 MG tablet Take 1 tablet (100 mg total) by mouth 2 (two) times daily. (Patient not taking: Reported on 05/01/2014) 28 tablet 0  . Vitamin D, Ergocalciferol, (DRISDOL) 50000 UNITS CAPS capsule Take 1 capsule (50,000 Units total) by mouth every 7 (seven) days. (Patient not taking: Reported on 03/26/2014) 12 capsule 0   No current facility-administered medications for this visit.    Review of Systems Review of Systems  Constitutional: Negative.   Gastrointestinal: Negative.   Genitourinary: Positive for menstrual problem. Negative for vaginal bleeding, vaginal discharge and pelvic pain.    Blood pressure 130/79, pulse 72, temperature 98 F (36.7 C), temperature source Oral, height  (1.676 m), weight 264 lb 9.6 oz (120.022 kg), last menstrual period 03/24/2014.  Physical Exam Physical Exam  Constitutional: She is oriented to person, place, and time. She appears well-developed. No distress.  obese  Cardiovascular: Normal rate.   Pulmonary/Chest: Effort normal. No respiratory distress.  Breasts: breasts appear normal, no suspicious masses, no skin or nipple changes or axillary nodesR>L size.   Abdominal: Soft. She exhibits no distension  and no mass. There is no tenderness.  Genitourinary: Vagina normal and uterus normal. No vaginal discharge found.  No mass. Cervical polyp. Pap done  Musculoskeletal: Normal range of motion.  Neurological: She is alert and oriented to person, place, and time.  Skin: Skin is warm and dry.  Psychiatric: She has a normal mood and affect. Her behavior is normal.    Data Reviewed MAU note CBC    Component Value Date/Time   WBC 8.6 03/18/2014 0434   RBC 4.56 03/18/2014 0434   HGB 13.5 03/18/2014 0434   HCT 41.4 03/18/2014 0434   PLT 171 03/18/2014 0434   MCV  90.8 03/18/2014 0434   MCH 29.6 03/18/2014 0434   MCHC 32.6 03/18/2014 0434   RDW 13.8 03/18/2014 0434   LYMPHSABS 1.6 03/18/2014 0434   MONOABS 0.7 03/18/2014 0434   EOSABS 0.1 03/18/2014 0434   BASOSABS 0.0 03/18/2014 0434     Assessment    DUB, oligomenorrhea, suspect PCOS     Plan    US, TSH, FSH, DHEAS, testosterone, RTC        Tasha Owens 05/01/2014, 2:47 PM

## 2014-05-02 LAB — TESTOSTERONE: TESTOSTERONE: 62 ng/dL (ref 10–70)

## 2014-05-02 LAB — WET PREP, GENITAL
CLUE CELLS WET PREP: NONE SEEN
TRICH WET PREP: NONE SEEN
WBC WET PREP: NONE SEEN
YEAST WET PREP: NONE SEEN

## 2014-05-02 LAB — FOLLICLE STIMULATING HORMONE: FSH: 3.9 m[IU]/mL

## 2014-05-02 LAB — TSH: TSH: 3.824 u[IU]/mL (ref 0.350–4.500)

## 2014-05-02 LAB — DHEA-SULFATE: DHEA-SO4: 204 ug/dL (ref 23–266)

## 2014-05-04 ENCOUNTER — Ambulatory Visit (HOSPITAL_BASED_OUTPATIENT_CLINIC_OR_DEPARTMENT_OTHER): Payer: Self-pay

## 2014-05-05 LAB — CYTOLOGY - PAP

## 2014-05-06 ENCOUNTER — Telehealth: Payer: Self-pay | Admitting: General Practice

## 2014-05-06 ENCOUNTER — Telehealth: Payer: Self-pay

## 2014-05-06 NOTE — Telephone Encounter (Signed)
Pt stated that someone called earlier and stated that her blood work was normal but did not know if that included her pap test as well.

## 2014-05-06 NOTE — Telephone Encounter (Signed)
-----   Message from Adam PhenixJames G Arnold, MD sent at 05/05/2014  4:09 PM EST ----- Normal labs

## 2014-05-06 NOTE — Telephone Encounter (Signed)
Called patient and informed her of normal labs and confirmed ultrasound appt. Patient verbalized understanding and had no questions

## 2014-05-07 NOTE — Telephone Encounter (Signed)
Attempted to contact patient. No answer. Left message informing her we are returning her call from yesterday and would like to inform her that yes, her normal results included her pap smear, call clinic with any further questions or concerns.

## 2014-05-09 ENCOUNTER — Ambulatory Visit (HOSPITAL_BASED_OUTPATIENT_CLINIC_OR_DEPARTMENT_OTHER): Payer: Self-pay | Attending: Internal Medicine

## 2014-05-09 VITALS — Ht 68.0 in | Wt 260.0 lb

## 2014-05-09 DIAGNOSIS — E089 Diabetes mellitus due to underlying condition without complications: Secondary | ICD-10-CM | POA: Insufficient documentation

## 2014-05-09 DIAGNOSIS — Z6839 Body mass index (BMI) 39.0-39.9, adult: Secondary | ICD-10-CM | POA: Insufficient documentation

## 2014-05-09 DIAGNOSIS — G4719 Other hypersomnia: Secondary | ICD-10-CM | POA: Insufficient documentation

## 2014-05-09 DIAGNOSIS — G2581 Restless legs syndrome: Secondary | ICD-10-CM | POA: Insufficient documentation

## 2014-05-14 ENCOUNTER — Telehealth: Payer: Self-pay | Admitting: Internal Medicine

## 2014-05-14 ENCOUNTER — Ambulatory Visit (HOSPITAL_COMMUNITY)
Admission: RE | Admit: 2014-05-14 | Discharge: 2014-05-14 | Disposition: A | Discharge status: Home / Self Care | Payer: Self-pay | Source: Ambulatory Visit | Attending: Obstetrics & Gynecology | Admitting: Obstetrics & Gynecology

## 2014-05-14 DIAGNOSIS — N939 Abnormal uterine and vaginal bleeding, unspecified: Secondary | ICD-10-CM | POA: Insufficient documentation

## 2014-05-14 NOTE — Telephone Encounter (Signed)
Pt wanting sleep study results and u/s results. Please f/u.

## 2014-05-15 ENCOUNTER — Telehealth: Payer: Self-pay | Admitting: Internal Medicine

## 2014-05-15 NOTE — Telephone Encounter (Signed)
Results not in yet

## 2014-05-15 NOTE — Telephone Encounter (Signed)
Patient called to request her results of sleep study and ultrasound. Please f/u with pt.

## 2014-05-15 NOTE — Telephone Encounter (Signed)
-----   Message from Adam PhenixJames G Arnold, MD sent at 05/15/2014  8:56 AM EST ----- US negative, suggest f/u visit 3-4 mo

## 2014-05-15 NOTE — Telephone Encounter (Signed)
Contacted patient, ultrasound results given, pt verbalizes understanding and has no further questions.

## 2014-05-17 DIAGNOSIS — G4719 Other hypersomnia: Secondary | ICD-10-CM

## 2014-05-17 DIAGNOSIS — G2581 Restless legs syndrome: Secondary | ICD-10-CM

## 2014-05-17 DIAGNOSIS — E089 Diabetes mellitus due to underlying condition without complications: Secondary | ICD-10-CM

## 2014-05-17 NOTE — Sleep Study (Signed)
   NAME: Tasha ShengChristina Owens DATE OF BIRTH:  01-Oct-1975 MEDICAL RECORD NUMBER [id number]  LOCATION: [facility]  PHYSICIAN: YOUNG,CLINTON D  DATE OF STUDY: 05/09/2014  SLEEP STUDY TYPE: Nocturnal Polysomnogram               REFERRING PHYSICIAN: Altha HarmMatthews, Michelle A, MD  INDICATION FOR STUDY: Hypersomnia with sleep apnea  EPWORTH SLEEPINESS SCORE:   14/24 HEIGHT: 5\' 8"  (172.7 cm)  WEIGHT: 260 lb (117.935 kg)    Body mass index is 39.54 kg/(m^2).  NECK SIZE: 19 in.  MEDICATIONS: Charted for review  SLEEP ARCHITECTURE: Total sleep time 323 minutes with sleep efficiency 85%. Stage I 18.7%, stage II 80.2%, stage III absent, REM 1.1% of total sleep time. Sleep latency 9 minutes, REM latency 260.5 minutes, awake after sleep onset 48 minutes, arousal index 119.3, bedtime medication: None  RESPIRATORY DATA: Apnea hypopnea index (AHI) 127.6 per hour. 687 total events scored including 675 obstructive apneas, 9 central apneas, 3 hypopneas. Events were mostly associated with nonsupine sleep position. REM AHI 85.7 per hour. This study was ordered with a diagnostic polysomnogram protocol, without CPAP.  OXYGEN DATA: Very loud snoring with oxygen desaturation to a nadir of 52% and mean saturation 83.9% on room air.  CARDIAC DATA: Sinus rhythm  MOVEMENT/PARASOMNIA: No significant movement disturbance, no bathroom trips  IMPRESSION/ RECOMMENDATION:   1) Very severe obstructive sleep apnea/hypopnea syndrome, AHI 127.6 per hour. Events were noted in all positions but she spent most time nonsupine. REM AHI 85.7 per hour. Very loud snoring with oxygen desaturation to a nadir of 52% and mean saturation 83.9% on room air. 2) This study was ordered as a diagnostic polysomnogram without CPAP. Scores in this range are usually addressed with CPAP. The patient can return for dedicated CPAP titration study if appropriate.    Waymon BudgeYOUNG,CLINTON D Diplomate, American Board of Sleep  Medicine  ELECTRONICALLY SIGNED ON:  05/17/2014, 9:17 AM [facility] PH: [phone]   FX: [phone] ACCREDITED BY THE AMERICAN ACADEMY OF SLEEP MEDICINE

## 2014-06-03 ENCOUNTER — Telehealth: Payer: Self-pay | Admitting: Internal Medicine

## 2014-06-03 NOTE — Telephone Encounter (Signed)
Pt called requesting sleep study results. Please f/u with pt

## 2014-06-05 ENCOUNTER — Telehealth: Payer: Self-pay

## 2014-06-05 NOTE — Telephone Encounter (Signed)
Patient called requesting a copy of sleep study Not yet scanned into chart

## 2014-06-26 ENCOUNTER — Telehealth: Payer: Self-pay

## 2014-06-26 NOTE — Telephone Encounter (Signed)
Patient called requesting results of her sleep study Can your review and advise Thank you

## 2014-06-26 NOTE — Telephone Encounter (Signed)
Can you check in the system, the recommendation from interpreting physician

## 2014-06-26 NOTE — Telephone Encounter (Signed)
Pt calling to follow up on sleep study results, please f/u with pt. Pt is concerned because she has not received results from study conducted in February.

## 2014-06-30 ENCOUNTER — Telehealth: Payer: Self-pay | Admitting: Internal Medicine

## 2014-06-30 NOTE — Telephone Encounter (Signed)
Pt calling about sleep study results, please f/u with pt asap.  Sleep study was conducted in February.

## 2014-07-03 ENCOUNTER — Telehealth: Payer: Self-pay

## 2014-07-03 ENCOUNTER — Telehealth: Payer: Self-pay | Admitting: Internal Medicine

## 2014-07-03 NOTE — Telephone Encounter (Signed)
lmtcb X1 for TEPPCO PartnersDenise

## 2014-07-03 NOTE — Telephone Encounter (Signed)
Spoke w and pt ith [facility], pt had a sleep study on 2/27 with recommendations to follow up with a cpap titration study.  Pt keeps calling Dr. Ricki RodriguezAdvani's office requesting further recs.  Dr. Maple HudsonYoung please advise on pt's recs.  Thanks!

## 2014-07-03 NOTE — Telephone Encounter (Signed)
Tasha SheldonAshley from [facility] returned my call She will speak with the Doctor on his recommendations of sleep study and Will call us back

## 2014-07-04 NOTE — Telephone Encounter (Signed)
She had severe obstructive sleep apnea on a study ordered by Dr Marthann SchillerMichelle Matthews. Her physician can either start treatment herself, or refer the patient (new to us) here for management of obstructive sleep apnea.

## 2014-07-04 NOTE — Telephone Encounter (Signed)
lmtcb x1 for TEPPCO PartnersDenise.

## 2014-07-04 NOTE — Telephone Encounter (Signed)
Dr. Young please advise.  Thanks! 

## 2014-07-07 ENCOUNTER — Telehealth: Payer: Self-pay

## 2014-07-07 NOTE — Telephone Encounter (Signed)
Spoke with patient and she is aware to either follow up with the ordering DR Marthann SchillerMichelle Matthews or  [facility]

## 2014-07-07 NOTE — Telephone Encounter (Signed)
Talked to denise with [facility]. She is aware that the patient can talk to dr Marthann Schillermichelle matthews to set up appt with them or call our office to see CY.

## 2014-07-07 NOTE — Telephone Encounter (Signed)
Returned PhoenixLindsay phone call from [facility] Patient can either follow up with Marthann SchillerMichelle matthews the ordering physician of sleep study or follow up With [facility]

## 2014-07-07 NOTE — Telephone Encounter (Signed)
lmtcb x2 for Tasha Owens.

## 2014-07-11 NOTE — Telephone Encounter (Signed)
Patient was notified that she can follow up with ordering physician  Or [facility]

## 2019-02-05 ENCOUNTER — Emergency Department (HOSPITAL_COMMUNITY): Payer: Self-pay

## 2019-02-05 ENCOUNTER — Emergency Department (HOSPITAL_COMMUNITY)
Admission: EM | Admit: 2019-02-05 | Discharge: 2019-02-05 | Disposition: A | Discharge status: Home / Self Care | Payer: Self-pay | Attending: Emergency Medicine | Admitting: Emergency Medicine

## 2019-02-05 DIAGNOSIS — J129 Viral pneumonia, unspecified: Secondary | ICD-10-CM

## 2019-02-05 DIAGNOSIS — J1289 Other viral pneumonia: Secondary | ICD-10-CM | POA: Insufficient documentation

## 2019-02-05 DIAGNOSIS — Z79899 Other long term (current) drug therapy: Secondary | ICD-10-CM | POA: Insufficient documentation

## 2019-02-05 DIAGNOSIS — I10 Essential (primary) hypertension: Secondary | ICD-10-CM | POA: Insufficient documentation

## 2019-02-05 DIAGNOSIS — Z20822 Contact with and (suspected) exposure to covid-19: Secondary | ICD-10-CM

## 2019-02-05 DIAGNOSIS — Z7982 Long term (current) use of aspirin: Secondary | ICD-10-CM | POA: Insufficient documentation

## 2019-02-05 DIAGNOSIS — U071 COVID-19: Secondary | ICD-10-CM | POA: Insufficient documentation

## 2019-02-05 LAB — CBC WITH DIFFERENTIAL/PLATELET
Abs Immature Granulocytes: 0.01 10*3/uL (ref 0.00–0.07)
Basophils Absolute: 0 10*3/uL (ref 0.0–0.1)
Basophils Relative: 0 %
Eosinophils Absolute: 0 10*3/uL (ref 0.0–0.5)
Eosinophils Relative: 0 %
HCT: 47.4 % — ABNORMAL HIGH (ref 36.0–46.0)
Hemoglobin: 15.2 g/dL — ABNORMAL HIGH (ref 12.0–15.0)
Immature Granulocytes: 0 %
Lymphocytes Relative: 23 %
Lymphs Abs: 1 10*3/uL (ref 0.7–4.0)
MCH: 29.1 pg (ref 26.0–34.0)
MCHC: 32.1 g/dL (ref 30.0–36.0)
MCV: 90.6 fL (ref 80.0–100.0)
Monocytes Absolute: 0.4 10*3/uL (ref 0.1–1.0)
Monocytes Relative: 11 %
Neutro Abs: 2.8 10*3/uL (ref 1.7–7.7)
Neutrophils Relative %: 66 %
Platelets: 98 10*3/uL — ABNORMAL LOW (ref 150–400)
RBC: 5.23 MIL/uL — ABNORMAL HIGH (ref 3.87–5.11)
RDW: 13.3 % (ref 11.5–15.5)
WBC: 4.2 10*3/uL (ref 4.0–10.5)
nRBC: 0 % (ref 0.0–0.2)

## 2019-02-05 LAB — COMPREHENSIVE METABOLIC PANEL
ALT: 18 U/L (ref 0–44)
AST: 23 U/L (ref 15–41)
Albumin: 3.7 g/dL (ref 3.5–5.0)
Alkaline Phosphatase: 52 U/L (ref 38–126)
Anion gap: 13 (ref 5–15)
BUN: 10 mg/dL (ref 6–20)
CO2: 25 mmol/L (ref 22–32)
Calcium: 8.2 mg/dL — ABNORMAL LOW (ref 8.9–10.3)
Chloride: 97 mmol/L — ABNORMAL LOW (ref 98–111)
Creatinine, Ser: 0.85 mg/dL (ref 0.44–1.00)
GFR calc Af Amer: >60 mL/min (ref >60)
GFR calc non Af Amer: >60 mL/min (ref >60)
Glucose, Bld: 110 mg/dL — ABNORMAL HIGH (ref 70–99)
Potassium: 3.5 mmol/L (ref 3.5–5.1)
Sodium: 135 mmol/L (ref 135–145)
Total Bilirubin: 0.3 mg/dL (ref 0.3–1.2)
Total Protein: 7.6 g/dL (ref 6.5–8.1)

## 2019-02-05 LAB — I-STAT BETA HCG BLOOD, ED (MC, WL, AP ONLY): I-stat hCG, quantitative: <5 m[IU]/mL (ref <5)

## 2019-02-05 MED ORDER — ONDANSETRON HCL 4 MG/2ML IJ SOLN
4.0000 mg | Freq: Once | INTRAMUSCULAR | Status: AC
  Administered 2019-02-05: 16:00:00 4 mg via INTRAVENOUS
  Filled 2019-02-05: qty 2

## 2019-02-05 MED ORDER — ONDANSETRON 4 MG PO TBDP: 4.0000 mg | ORAL_TABLET | Freq: Three times a day (TID) | ORAL | 0 refills | Status: AC | PRN

## 2019-02-05 MED ORDER — ACETAMINOPHEN 325 MG PO TABS
650.0000 mg | ORAL_TABLET | Freq: Once | ORAL | Status: AC
  Administered 2019-02-05: 16:00:00 650 mg via ORAL
  Filled 2019-02-05: qty 2

## 2019-02-05 NOTE — ED Provider Notes (Signed)
[facility] Provider Note   CSN: [id number] Arrival date & time: 02/05/19  1030     History   Chief Complaint Chief Complaint  Patient presents with  . Weakness    HPI Tasha Owens is a 43 y.o. female.     HPI  Tasha Owens is a 43 y.o. female, with a history of HTN, presenting to the ED with cough for last two days. Accompanied by nausea, fatigue, chills, loss of taste, body aches, and overall feeling unwell.  Her church has been closed down due to COVID-19 infections. She has been attending in person each Sunday.  Denies shortness of breath, vomiting, abdominal pain, chest pain, dizziness, syncope, or any other complaints.    Past Medical History:  Diagnosis Date  . Hypertension     Patient Active Problem List   Diagnosis Date Noted  . Abnormal uterine bleeding 05/01/2014  . Dyspepsia 03/17/2014  . Excessive daytime sleepiness 09/09/2013  . Essential hypertension 09/09/2013  . Severe obesity (BMI >= 40) (HCC) 09/09/2013  . Hyperglycemia 09/09/2013  . Metabolic syndrome 09/09/2013  . Edema of both legs 09/09/2013  . Restless legs 09/09/2013  . Knee pain, chronic 09/09/2013  . HTN (hypertension) 08/15/2013  . Prediabetes 08/15/2013  . Obesity 08/15/2013    Past Surgical History:  Procedure Laterality Date  . NO PAST SURGERIES       OB History    Gravida  1   Para  1   Term  1   Preterm      AB      Living  1     SAB      TAB      Ectopic      Multiple      Live Births               Home Medications    Prior to Admission medications   Medication Sig Start Date End Date Taking? Authorizing Provider  aspirin EC 81 MG tablet Take 81 mg by mouth once.    [provider]  calcium carbonate (TUMS EX) 750 MG chewable tablet Chew 4 tablets by mouth daily as needed for heartburn.    [provider]  carbamide peroxide (DEBROX) 6.5 % otic solution Place 5 drops into both  ears 2 (two) times daily. Patient not taking: Reported on 04/25/2014 03/17/14   Doris CheadleAdvani, Deepak, MD  cephALEXin (KEFLEX) 500 MG capsule Take 1 capsule (500 mg total) by mouth 4 (four) times daily. Patient not taking: Reported on 04/25/2014 03/18/14   Harle Battiestysinger, Elizabeth, NP  cholecalciferol (VITAMIN D) 1000 UNITS tablet Take 2,000 Units by mouth daily.    [provider]  Cinnamon 500 MG TABS Take by mouth.    [provider]  losartan-hydrochlorothiazide (HYZAAR) 50-12.5 MG per tablet Take 1 tablet by mouth daily. 03/17/14   Doris CheadleAdvani, Deepak, MD  Multiple Vitamin (MULTIVITAMIN WITH MINERALS) TABS tablet Take 1 tablet by mouth daily.    [provider]  omeprazole (PRILOSEC) 20 MG capsule Take 1 capsule (20 mg total) by mouth daily. 03/17/14   Doris CheadleAdvani, Deepak, MD  Riboflavin (VITAMIN B2 PO) Take 1 tablet by mouth daily.     [provider]  Vitamin D, Ergocalciferol, (DRISDOL) 50000 UNITS CAPS capsule Take 1 capsule (50,000 Units total) by mouth every 7 (seven) days. Patient not taking: Reported on 03/26/2014 03/20/14   Doris CheadleAdvani, Deepak, MD    Family History Family History  Problem Relation Age of  Onset  . Coronary artery disease Maternal Grandmother   . Diabetes Maternal Grandfather   . Coronary artery disease Maternal Grandfather   . Diabetes Father   . Heart disease Father   . Cancer Father   . Cancer Maternal Aunt        leukemia    Social History Social History   Tobacco Use  . Smoking status: Never Smoker  . Smokeless tobacco: Never Used  Substance Use Topics  . Alcohol use: No    Alcohol/week: 0.0 standard drinks  . Drug use: No     Allergies   Lisinopril   Review of Systems Review of Systems  Constitutional: Positive for appetite change, chills and fatigue.  Respiratory: Positive for cough. Negative for shortness of breath.   Cardiovascular: Negative for chest pain and leg swelling.  Gastrointestinal: Positive for nausea. Negative for  abdominal pain, diarrhea and vomiting.  Musculoskeletal: Positive for myalgias.  Neurological: Positive for weakness. Negative for syncope.  All other systems reviewed and are negative.    Physical Exam Updated Vital Signs BP (!) 150/84 (BP Location: Right Arm)   Pulse 100   Temp (!) 100.6 F (38.1 C) (Oral)   Resp 16   SpO2 95%   Physical Exam Vitals signs and nursing note reviewed.  Constitutional:      General: She is not in acute distress.    Appearance: She is well-developed. She is obese. She is not diaphoretic.  HENT:     Head: Normocephalic and atraumatic.     Mouth/Throat:     Mouth: Mucous membranes are moist.     Pharynx: Oropharynx is clear.  Eyes:     Conjunctiva/sclera: Conjunctivae normal.  Neck:     Musculoskeletal: Neck supple.  Cardiovascular:     Rate and Rhythm: Normal rate and regular rhythm.     Pulses: Normal pulses.          Radial pulses are 2+ on the right side and 2+ on the left side.       Posterior tibial pulses are 2+ on the right side and 2+ on the left side.     Heart sounds: Normal heart sounds.     Comments: Tactile temperature in the extremities appropriate and equal bilaterally. Pulmonary:     Effort: Pulmonary effort is normal. No respiratory distress.     Breath sounds: Normal breath sounds.  Abdominal:     Palpations: Abdomen is soft.     Tenderness: There is no abdominal tenderness. There is no guarding.  Musculoskeletal:     Right lower leg: No edema.     Left lower leg: No edema.  Lymphadenopathy:     Cervical: No cervical adenopathy.  Skin:    General: Skin is warm and dry.  Neurological:     Mental Status: She is alert.  Psychiatric:        Mood and Affect: Mood and affect normal.        Speech: Speech normal.        Behavior: Behavior normal.      ED Treatments / Results  Labs (all labs ordered are listed, but only abnormal results are displayed) Labs Reviewed - No data to display  EKG None  Radiology Dg  Chest Portable 1 View  Result Date: 02/05/2019 CLINICAL DATA:  Shortness of breath. EXAM: PORTABLE CHEST 1 VIEW COMPARISON:  None. FINDINGS: The heart size and mediastinal contours are within normal limits. No pneumothorax or pleural effusion is noted. Patchy opacities are  noted in the right upper lobe as well as both lung bases concerning for multifocal pneumonia. The visualized skeletal structures are unremarkable. IMPRESSION: Bilateral lung opacities are noted concerning for multifocal pneumonia, potentially of viral etiology. Electronically Signed   By: Lupita Raider M.D.   On: 02/05/2019 13:52    Procedures Procedures (including critical care time)  Medications Ordered in ED Medications - No data to display   Initial Impression / Assessment and Plan / ED Course  I have reviewed the triage vital signs and the nursing notes.  Pertinent labs & imaging results that were available during my care of the patient were reviewed by me and considered in my medical decision making (see chart for details).        Patient presents with cough, generalized weakness, fatigue, chills for the last several days. Fever and mild tachycardia.  Nontoxic-appearing and in no apparent distress.  Chest x-ray with evidence of multifocal pneumonia, suspicious for possible COVID-19 infection.  End of shift patient care handoff report given to Rhea Bleacher, PA-C. Plan: Lab work still pending.  Ambulate patient with SPO2.   Final Clinical Impressions(s) / ED Diagnoses   Final diagnoses:  None    ED Discharge Orders    None       Concepcion Living 02/05/19 1646    Terald Sleeper, MD 02/06/19 [phone]

## 2019-02-05 NOTE — ED Triage Notes (Signed)
Patient to ER for evaluation of possible COVID, reports has been around multiple confirmed cases. Reports 3-4 days of dry cough with body aches and generalized weakness/fatigue. She is in NAD.

## 2019-02-05 NOTE — Discharge Instructions (Addendum)
Please read and follow all provided instructions.  Your diagnoses today include:  1. Viral pneumonia   2. Suspected COVID-19 virus infection     Tests performed today include:  Blood counts and electrolytes  Chest x-ray -shows pneumonia that is concerning for coronavirus  Covid test -is pending  Vital signs. See below for your results today.   Medications prescribed:   Zofran (ondansetron) - for nausea and vomiting  Take any prescribed medications only as directed.  Home care instructions:  Follow any educational materials contained in this packet.  Please continue to isolate yourself until your coronavirus test returns.  If positive as expected, you will need to quarantine yourself and isolate for 14 days and until you have 3 days of improvement.  Follow-up instructions: Please follow-up with your primary care provider in the next 3 days for further evaluation of your symptoms.   Return instructions:   Please return to the Emergency Department if you experience worsening symptoms.   Return to the emergency department with worsening shortness of breath, trouble breathing, persistent vomiting, significant chest pain, or other concerns.  Please return if you have any other emergent concerns.  Additional Information:  Your vital signs today were: BP 113/69    Pulse 99    Temp (!) 100.6 F (38.1 C) (Oral)    Resp (!) 29    SpO2 92%  If your blood pressure (BP) was elevated above 135/85 this visit, please have this repeated by your doctor within one month. --------------

## 2019-02-05 NOTE — ED Notes (Signed)
Patient verbalizes understanding of discharge instructions. Opportunity for questioning and answers were provided. Armband removed by staff, pt discharged from ED.  

## 2019-02-05 NOTE — ED Notes (Signed)
Patient ambulated in the room, O2 between 89-92% on room air

## 2019-02-05 NOTE — ED Provider Notes (Signed)
6:17 PM patient presents the emergency departments with multiple symptoms.  Signout from [place], [place] at shift change.  Based on the appearance of her x-ray, suspect coronavirus.  Patient does have some comorbidities including diabetes and obesity.  I personally evaluated and spoke with the patient.  We reviewed her lab results.  Clinically she looks very well.  She states she is feeling a bit better.  She is breathing about 20 times per minute and talking in full sentences during my exam.  She is in no respiratory distress and is not using any accessory muscles.  Her pulse oxygenation during conversation ranges between 91% and 93%.  She states that she would like to go home.  She has a 43 year old daughter who would like her to be home.  She lives in [place] and is readily able to return if her symptoms do get worse.  Her mother lives in apartment above her and is around.  Discussed borderline vital signs with the patient.  We discussed strict return precautions including worsening shortness of breath or trouble breathing, chest pain, persistent vomiting.  We discussed that she is early in her illness and her course could worsen, and it is very difficult to predict the progression of coronavirus.  Discussed isolation for 14 days with 3 days of improvement if she does test positive.  BP 113/69   Pulse 99   Temp (!) 100.6 F (38.1 C) (Oral)   Resp (!) 29   SpO2 92%    Results for orders placed or performed during the hospital encounter of 02/05/19  Comprehensive metabolic panel  Result Value Ref Range   Sodium 135 135 - 145 mmol/L   Potassium 3.5 3.5 - 5.1 mmol/L   Chloride 97 (L) 98 - 111 mmol/L   CO2 25 22 - 32 mmol/L   Glucose, Bld 110 (H) 70 - 99 mg/dL   BUN 10 6 - 20 mg/dL   Creatinine, Ser 0.85 0.44 - 1.00 mg/dL   Calcium 8.2 (L) 8.9 - 10.3 mg/dL   Total Protein 7.6 6.5 - 8.1 g/dL   Albumin 3.7 3.5 - 5.0 g/dL   AST 23 15 - 41 U/L   ALT 18 0 - 44 U/L   Alkaline Phosphatase 52  38 - 126 U/L   Total Bilirubin 0.3 0.3 - 1.2 mg/dL   GFR calc non Af Amer >60 >60 mL/min   GFR calc Af Amer >60 >60 mL/min   Anion gap 13 5 - 15  CBC with Differential  Result Value Ref Range   WBC 4.2 4.0 - 10.5 K/uL   RBC 5.23 (H) 3.87 - 5.11 MIL/uL   Hemoglobin 15.2 (H) 12.0 - 15.0 g/dL   HCT 47.4 (H) 36.0 - 46.0 %   MCV 90.6 80.0 - 100.0 fL   MCH 29.1 26.0 - 34.0 pg   MCHC 32.1 30.0 - 36.0 g/dL   RDW 13.3 11.5 - 15.5 %   Platelets 98 (L) 150 - 400 K/uL   nRBC 0.0 0.0 - 0.2 %   Neutrophils Relative % 66 %   Neutro Abs 2.8 1.7 - 7.7 K/uL   Lymphocytes Relative 23 %   Lymphs Abs 1.0 0.7 - 4.0 K/uL   Monocytes Relative 11 %   Monocytes Absolute 0.4 0.1 - 1.0 K/uL   Eosinophils Relative 0 %   Eosinophils Absolute 0.0 0.0 - 0.5 K/uL   Basophils Relative 0 %   Basophils Absolute 0.0 0.0 - 0.1 K/uL   RBC Morphology  MORPHOLOGY UNREMARKABLE    Immature Granulocytes 0 %   Abs Immature Granulocytes 0.01 0.00 - 0.07 K/uL  I-Stat beta hCG blood, ED  Result Value Ref Range   I-stat hCG, quantitative <5.0 <5 mIU/mL   Comment 3           Dg Chest Portable 1 View  Result Date: 02/05/2019 CLINICAL DATA:  Shortness of breath. EXAM: PORTABLE CHEST 1 VIEW COMPARISON:  None. FINDINGS: The heart size and mediastinal contours are within normal limits. No pneumothorax or pleural effusion is noted. Patchy opacities are noted in the right upper lobe as well as both lung bases concerning for multifocal pneumonia. The visualized skeletal structures are unremarkable. IMPRESSION: Bilateral lung opacities are noted concerning for multifocal pneumonia, potentially of viral etiology. Electronically Signed   By: Lupita Raider M.D.   On: 02/05/2019 13:52      Renne Crigler, PA-C 02/05/19 1825    Charlynne Pander, MD 02/05/19 2012

## 2019-02-06 LAB — SARS CORONAVIRUS 2 (TAT 6-24 HRS): SARS Coronavirus 2: POSITIVE — AB

## 2021-03-06 IMAGING — DX DG CHEST 1V PORT
1 series · 1 of 1 positions shown · non-contrast
Comparison: None.

CLINICAL DATA: Shortness of breath.

EXAM:
PORTABLE CHEST 1 VIEW

[chest]
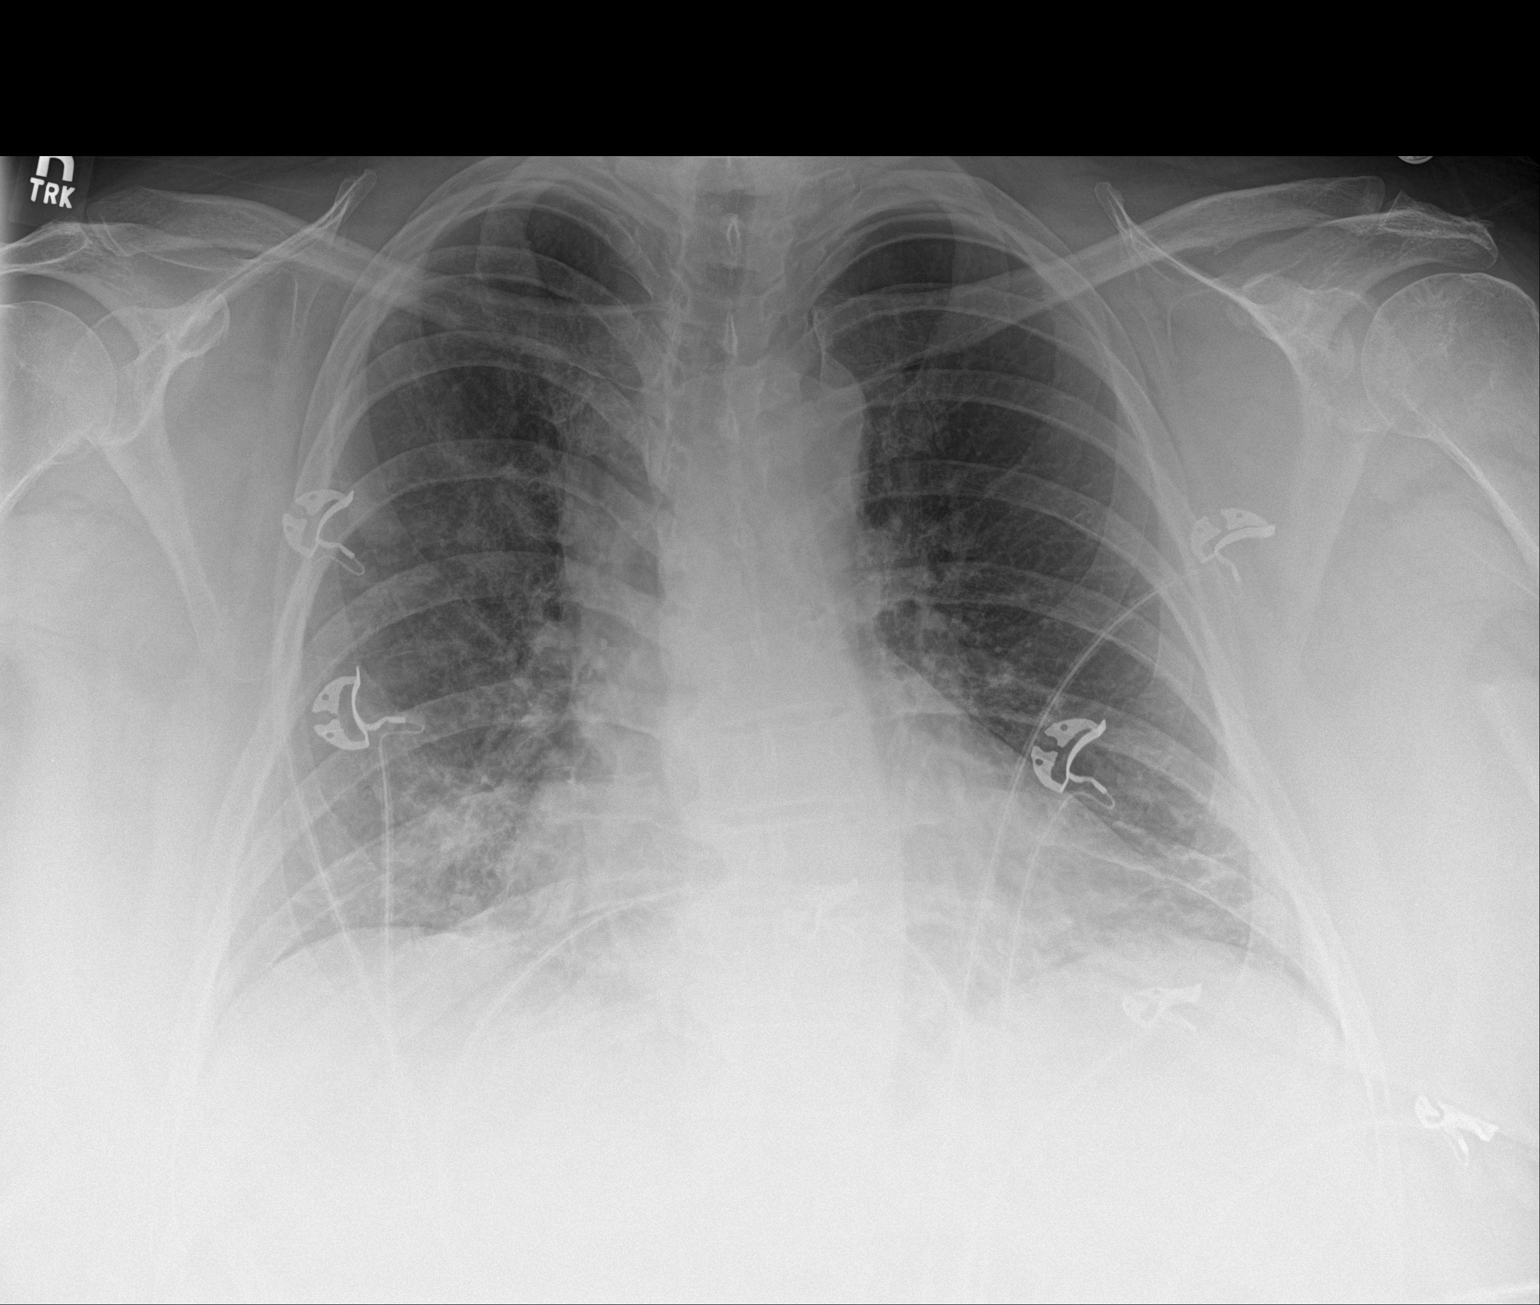

[1 of 1 positions shown; findings below may reference images not displayed]

FINDINGS: The heart size and mediastinal contours are within normal limits. No
pneumothorax or pleural effusion is noted. Patchy opacities are
noted in the right upper lobe as well as both lung bases concerning
for multifocal pneumonia. The visualized skeletal structures are
unremarkable.
IMPRESSION: Bilateral lung opacities are noted concerning for multifocal
pneumonia, potentially of viral etiology.
